# Patient Record
Sex: Male | Born: 1960 | Race: White | Hispanic: No | Marital: Married | Smoking: Light tobacco smoker
Health system: Southern US, Community
[De-identification: ages and names within clinical notes are randomized; demographics above are authoritative.]

## PROBLEM LIST (undated history)

## (undated) DIAGNOSIS — E7800 Pure hypercholesterolemia, unspecified: Secondary | ICD-10-CM

## (undated) DIAGNOSIS — D6851 Activated protein C resistance: Secondary | ICD-10-CM

## (undated) DIAGNOSIS — R51 Headache: Secondary | ICD-10-CM

## (undated) DIAGNOSIS — I639 Cerebral infarction, unspecified: Secondary | ICD-10-CM

## (undated) HISTORY — PX: TONSILLECTOMY: SUR1361

## (undated) HISTORY — PX: COLONOSCOPY: SHX174

---

## 1991-05-09 HISTORY — PX: VASECTOMY: SHX75

## 2013-02-01 ENCOUNTER — Other Ambulatory Visit: Payer: Self-pay | Admitting: Orthopedic Surgery

## 2013-02-04 ENCOUNTER — Encounter (HOSPITAL_COMMUNITY): Payer: Self-pay | Admitting: Pharmacist

## 2013-02-08 ENCOUNTER — Encounter (HOSPITAL_COMMUNITY): Payer: Self-pay

## 2013-02-08 ENCOUNTER — Other Ambulatory Visit (HOSPITAL_COMMUNITY): Payer: Self-pay | Admitting: *Deleted

## 2013-02-08 ENCOUNTER — Ambulatory Visit (HOSPITAL_COMMUNITY)
Admission: RE | Admit: 2013-02-08 | Discharge: 2013-02-08 | Disposition: A | Discharge status: Home / Self Care | Payer: BC Managed Care – PPO | Source: Ambulatory Visit | Attending: Orthopedic Surgery | Admitting: Orthopedic Surgery

## 2013-02-08 ENCOUNTER — Encounter (HOSPITAL_COMMUNITY)
Admission: RE | Admit: 2013-02-08 | Discharge: 2013-02-08 | Disposition: A | Discharge status: Home / Self Care | Payer: BC Managed Care – PPO | Source: Ambulatory Visit | Attending: Orthopedic Surgery | Admitting: Orthopedic Surgery

## 2013-02-08 DIAGNOSIS — Z01810 Encounter for preprocedural cardiovascular examination: Secondary | ICD-10-CM | POA: Insufficient documentation

## 2013-02-08 DIAGNOSIS — Z01812 Encounter for preprocedural laboratory examination: Secondary | ICD-10-CM | POA: Insufficient documentation

## 2013-02-08 DIAGNOSIS — Z01818 Encounter for other preprocedural examination: Secondary | ICD-10-CM | POA: Insufficient documentation

## 2013-02-08 HISTORY — DX: Activated protein C resistance: D68.51

## 2013-02-08 HISTORY — DX: Headache: R51

## 2013-02-08 HISTORY — DX: Pure hypercholesterolemia, unspecified: E78.00

## 2013-02-08 HISTORY — DX: Cerebral infarction, unspecified: I63.9

## 2013-02-08 LAB — URINALYSIS, ROUTINE W REFLEX MICROSCOPIC
Glucose, UA: NEGATIVE mg/dL
Hgb urine dipstick: NEGATIVE
Ketones, ur: NEGATIVE mg/dL
Leukocytes, UA: NEGATIVE
Protein, ur: NEGATIVE mg/dL

## 2013-02-08 LAB — COMPREHENSIVE METABOLIC PANEL
BUN: 16 mg/dL (ref 6–23)
CO2: 26 mEq/L (ref 19–32)
Calcium: 9.3 mg/dL (ref 8.4–10.5)
Creatinine, Ser: 1.15 mg/dL (ref 0.50–1.35)
GFR calc Af Amer: 83 mL/min — ABNORMAL LOW (ref >90)
GFR calc non Af Amer: 72 mL/min — ABNORMAL LOW (ref >90)
Glucose, Bld: 61 mg/dL — ABNORMAL LOW (ref 70–99)

## 2013-02-08 LAB — PROTIME-INR
INR: 1 (ref 0.00–1.49)
Prothrombin Time: 13 seconds (ref 11.6–15.2)

## 2013-02-08 LAB — CBC WITH DIFFERENTIAL/PLATELET
Eosinophils Relative: 2 % (ref 0–5)
HCT: 45 % (ref 39.0–52.0)
Hemoglobin: 16.7 g/dL (ref 13.0–17.0)
Lymphocytes Relative: 24 % (ref 12–46)
Lymphs Abs: 1.4 10*3/uL (ref 0.7–4.0)
MCV: 89.6 fL (ref 78.0–100.0)
Monocytes Absolute: 0.6 10*3/uL (ref 0.1–1.0)
Monocytes Relative: 10 % (ref 3–12)
RBC: 5.02 MIL/uL (ref 4.22–5.81)
WBC: 5.8 10*3/uL (ref 4.0–10.5)

## 2013-02-08 LAB — ABO/RH: ABO/RH(D): O POS

## 2013-02-08 LAB — APTT: aPTT: 31 seconds (ref 24–37)

## 2013-02-08 LAB — TYPE AND SCREEN
ABO/RH(D): O POS
Antibody Screen: NEGATIVE

## 2013-02-08 NOTE — Pre-Procedure Instructions (Signed)
Todd Stafford  02/08/2013   Your procedure is scheduled on:  Thursday, February 10, 2013 at 1:30 PM.   Report to [facility] Entrance "A" at 11:30 AM.   Call this number if you have problems the morning of surgery: [phone]   Remember:   Do not eat food or drink liquids after midnight Wednesday, 02/09/13.   Take these medicines the morning of surgery with A SIP OF WATER: HYDROcodone-acetaminophen (NORCO/VICODIN) - if needed, cyclobenzaprine (FLEXERIL) - if needed, SUMAtriptan (IMITREX) - if needed.  Do not use Excedrin Migraine prior to surgery as of today. Stop Plavix if you have not already done so.    Do not wear jewelry.  Do not wear lotions, powders, or cologne. You may wear deodorant.             Men may shave face and neck.  Do not bring valuables to the hospital.  [facility] is not responsible                  for any belongings or valuables.               Contacts, dentures or bridgework may not be worn into surgery.  Leave suitcase in the car. After surgery it may be brought to your room.  For patients admitted to the hospital, discharge time is determined by your                treatment team.               Patients discharged the day of surgery will not be allowed to drive  home.  Name and phone number of your driver: Family/friend   Special Instructions: Shower using CHG 2 nights before surgery and the night before surgery.  If you shower the day of surgery use CHG.  Use special wash - you have one bottle of CHG for all showers.  You should use approximately 1/3 of the bottle for each shower.   Please read over the following fact sheets that you were given: Pain Booklet, Coughing and Deep Breathing, Blood Transfusion Information, MRSA Information and Surgical Site Infection Prevention

## 2013-02-09 MED ORDER — CEFAZOLIN SODIUM-DEXTROSE 2-3 GM-% IV SOLR
2.0000 g | INTRAVENOUS | Status: AC
  Administered 2013-02-10: 2 g via INTRAVENOUS
  Filled 2013-02-09: qty 50

## 2013-02-10 ENCOUNTER — Ambulatory Visit (HOSPITAL_COMMUNITY): Payer: BC Managed Care – PPO | Admitting: Certified Registered"

## 2013-02-10 ENCOUNTER — Other Ambulatory Visit: Payer: Self-pay

## 2013-02-10 ENCOUNTER — Ambulatory Visit (HOSPITAL_COMMUNITY): Payer: BC Managed Care – PPO

## 2013-02-10 ENCOUNTER — Encounter (HOSPITAL_COMMUNITY)
Admission: RE | Disposition: A | Discharge status: Home / Self Care | Payer: Self-pay | Source: Ambulatory Visit | Attending: Orthopedic Surgery

## 2013-02-10 ENCOUNTER — Encounter (HOSPITAL_COMMUNITY): Payer: Self-pay | Admitting: *Deleted

## 2013-02-10 ENCOUNTER — Ambulatory Visit (HOSPITAL_COMMUNITY)
Admission: RE | Admit: 2013-02-10 | Discharge: 2013-02-10 | Disposition: A | Discharge status: Home / Self Care | Payer: BC Managed Care – PPO | Source: Ambulatory Visit | Attending: Orthopedic Surgery | Admitting: Orthopedic Surgery

## 2013-02-10 ENCOUNTER — Encounter (HOSPITAL_COMMUNITY): Payer: BC Managed Care – PPO | Admitting: Certified Registered"

## 2013-02-10 DIAGNOSIS — D6859 Other primary thrombophilia: Secondary | ICD-10-CM | POA: Insufficient documentation

## 2013-02-10 DIAGNOSIS — E7800 Pure hypercholesterolemia, unspecified: Secondary | ICD-10-CM | POA: Insufficient documentation

## 2013-02-10 DIAGNOSIS — M5126 Other intervertebral disc displacement, lumbar region: Secondary | ICD-10-CM | POA: Insufficient documentation

## 2013-02-10 DIAGNOSIS — Z8673 Personal history of transient ischemic attack (TIA), and cerebral infarction without residual deficits: Secondary | ICD-10-CM | POA: Insufficient documentation

## 2013-02-10 HISTORY — PX: LUMBAR LAMINECTOMY: SHX95

## 2013-02-10 SURGERY — MICRODISCECTOMY LUMBAR LAMINECTOMY
Anesthesia: General | Site: Spine Lumbar | Laterality: Left | Wound class: Clean

## 2013-02-10 MED ORDER — MIDAZOLAM HCL 5 MG/5ML IJ SOLN
INTRAMUSCULAR | Status: DC | PRN
  Administered 2013-02-10: 2 mg via INTRAVENOUS

## 2013-02-10 MED ORDER — METHYLENE BLUE 1 % INJ SOLN
INTRAMUSCULAR | Status: DC | PRN
  Administered 2013-02-10: .5 mL

## 2013-02-10 MED ORDER — THROMBIN 20000 UNITS EX SOLR
CUTANEOUS | Status: AC
  Filled 2013-02-10: qty 20000

## 2013-02-10 MED ORDER — LIDOCAINE HCL (CARDIAC) 20 MG/ML IV SOLN
INTRAVENOUS | Status: DC | PRN
  Administered 2013-02-10: 60 mg via INTRAVENOUS

## 2013-02-10 MED ORDER — BUPIVACAINE-EPINEPHRINE 0.25% -1:200000 IJ SOLN
INTRAMUSCULAR | Status: DC | PRN
  Administered 2013-02-10: 10 mL

## 2013-02-10 MED ORDER — LACTATED RINGERS IV SOLN
INTRAVENOUS | Status: DC
  Administered 2013-02-10 (×2): via INTRAVENOUS

## 2013-02-10 MED ORDER — SUFENTANIL CITRATE 50 MCG/ML IV SOLN
INTRAVENOUS | Status: DC | PRN
  Administered 2013-02-10: 5 ug via INTRAVENOUS
  Administered 2013-02-10: 15 ug via INTRAVENOUS

## 2013-02-10 MED ORDER — PROMETHAZINE HCL 25 MG/ML IJ SOLN: 6.2500 mg | INTRAMUSCULAR | Status: DC | PRN

## 2013-02-10 MED ORDER — POVIDONE-IODINE 7.5 % EX SOLN
Freq: Once | CUTANEOUS | Status: DC
  Filled 2013-02-10: qty 118

## 2013-02-10 MED ORDER — ROCURONIUM BROMIDE 100 MG/10ML IV SOLN
INTRAVENOUS | Status: DC | PRN
  Administered 2013-02-10: 50 mg via INTRAVENOUS

## 2013-02-10 MED ORDER — THROMBIN 20000 UNITS EX SOLR
CUTANEOUS | Status: DC | PRN
  Administered 2013-02-10: 14:00:00

## 2013-02-10 MED ORDER — METHYLPREDNISOLONE ACETATE 40 MG/ML IJ SUSP
INTRAMUSCULAR | Status: DC | PRN
  Administered 2013-02-10: 40 mg

## 2013-02-10 MED ORDER — HEMOSTATIC AGENTS (NO CHARGE) OPTIME
TOPICAL | Status: DC | PRN
  Administered 2013-02-10: 1

## 2013-02-10 MED ORDER — METHYLPREDNISOLONE ACETATE 40 MG/ML IJ SUSP
INTRAMUSCULAR | Status: AC
  Filled 2013-02-10: qty 1

## 2013-02-10 MED ORDER — ONDANSETRON HCL 4 MG/2ML IJ SOLN
INTRAMUSCULAR | Status: DC | PRN
  Administered 2013-02-10: 4 mg via INTRAVENOUS

## 2013-02-10 MED ORDER — PROPOFOL 10 MG/ML IV BOLUS
INTRAVENOUS | Status: DC | PRN
  Administered 2013-02-10: 125 mg via INTRAVENOUS

## 2013-02-10 MED ORDER — HYDROMORPHONE HCL PF 1 MG/ML IJ SOLN
0.2500 mg | INTRAMUSCULAR | Status: DC | PRN
  Administered 2013-02-10: 0.5 mg via INTRAVENOUS

## 2013-02-10 MED ORDER — HYDROMORPHONE HCL PF 1 MG/ML IJ SOLN
INTRAMUSCULAR | Status: AC
  Filled 2013-02-10: qty 1

## 2013-02-10 MED ORDER — 0.9 % SODIUM CHLORIDE (POUR BTL) OPTIME
TOPICAL | Status: DC | PRN
  Administered 2013-02-10: 1000 mL

## 2013-02-10 MED ORDER — BUPIVACAINE-EPINEPHRINE PF 0.25-1:200000 % IJ SOLN
INTRAMUSCULAR | Status: AC
  Filled 2013-02-10: qty 30

## 2013-02-10 SURGICAL SUPPLY — 61 items
BENZOIN TINCTURE PRP APPL 2/3 (GAUZE/BANDAGES/DRESSINGS) ×2 IMPLANT
BUR ROUND PRECISION 4.0 (BURR) ×2 IMPLANT
CANISTER SUCTION 2500CC (MISCELLANEOUS) ×2 IMPLANT
CLOTH BEACON ORANGE TIMEOUT ST (SAFETY) ×2 IMPLANT
CLSR STERI-STRIP ANTIMIC 1/2X4 (GAUZE/BANDAGES/DRESSINGS) ×2 IMPLANT
CONT SPEC STER OR (MISCELLANEOUS) ×2 IMPLANT
CORDS BIPOLAR (ELECTRODE) ×2 IMPLANT
COVER SURGICAL LIGHT HANDLE (MISCELLANEOUS) ×2 IMPLANT
DRAIN CHANNEL 10F 3/8 F FF (DRAIN) IMPLANT
DRAPE POUCH INSTRU U-SHP 10X18 (DRAPES) ×4 IMPLANT
DRAPE SURG 17X23 STRL (DRAPES) ×8 IMPLANT
DURAPREP 26ML APPLICATOR (WOUND CARE) ×2 IMPLANT
ELECT BLADE 4.0 EZ CLEAN MEGAD (MISCELLANEOUS)
ELECT BLADE 6.5 EXT (BLADE) IMPLANT
ELECT CAUTERY BLADE 6.4 (BLADE) ×2 IMPLANT
ELECT REM PT RETURN 9FT ADLT (ELECTROSURGICAL) ×2
ELECTRODE BLDE 4.0 EZ CLN MEGD (MISCELLANEOUS) IMPLANT
ELECTRODE REM PT RTRN 9FT ADLT (ELECTROSURGICAL) ×1 IMPLANT
EVACUATOR SILICONE 100CC (DRAIN) IMPLANT
FILTER STRAW FLUID ASPIR (MISCELLANEOUS) ×2 IMPLANT
GAUZE SPONGE 4X4 16PLY XRAY LF (GAUZE/BANDAGES/DRESSINGS) ×4 IMPLANT
GLOVE BIO SURGEON STRL SZ7 (GLOVE) ×2 IMPLANT
GLOVE BIO SURGEON STRL SZ8 (GLOVE) ×4 IMPLANT
GLOVE BIOGEL PI IND STRL 8 (GLOVE) ×1 IMPLANT
GLOVE BIOGEL PI INDICATOR 8 (GLOVE) ×1
GOWN STRL NON-REIN LRG LVL3 (GOWN DISPOSABLE) ×4 IMPLANT
IV CATH 14GX2 1/4 (CATHETERS) ×2 IMPLANT
KIT BASIN OR (CUSTOM PROCEDURE TRAY) ×2 IMPLANT
KIT ROOM TURNOVER OR (KITS) ×2 IMPLANT
NEEDLE 18GX1X1/2 (RX/OR ONLY) (NEEDLE) ×2 IMPLANT
NEEDLE HYPO 25GX1X1/2 BEV (NEEDLE) ×2 IMPLANT
NEEDLE SPNL 18GX3.5 QUINCKE PK (NEEDLE) ×4 IMPLANT
NEEDLE SPNL 22GX3.5 QUINCKE BK (NEEDLE) IMPLANT
NS IRRIG 1000ML POUR BTL (IV SOLUTION) ×2 IMPLANT
PACK LAMINECTOMY ORTHO (CUSTOM PROCEDURE TRAY) ×2 IMPLANT
PACK UNIVERSAL I (CUSTOM PROCEDURE TRAY) ×2 IMPLANT
PAD ARMBOARD 7.5X6 YLW CONV (MISCELLANEOUS) ×4 IMPLANT
PATTIES SURGICAL .5 X.5 (GAUZE/BANDAGES/DRESSINGS) IMPLANT
PATTIES SURGICAL .5 X1 (DISPOSABLE) ×2 IMPLANT
PATTIES SURGICAL 1X1 (DISPOSABLE) IMPLANT
SPONGE GAUZE 4X4 12PLY (GAUZE/BANDAGES/DRESSINGS) ×2 IMPLANT
STRIP CLOSURE SKIN 1/2X4 (GAUZE/BANDAGES/DRESSINGS) IMPLANT
SURGIFLO TRUKIT (HEMOSTASIS) ×2 IMPLANT
SUT ETHILON 3 0 FSL (SUTURE) IMPLANT
SUT VIC AB 0 CT1 27 (SUTURE) ×1
SUT VIC AB 0 CT1 27XBRD ANBCTR (SUTURE) ×1 IMPLANT
SUT VIC AB 0 CT2 27 (SUTURE) ×4 IMPLANT
SUT VIC AB 1 CT1 18XCR BRD 8 (SUTURE) ×1 IMPLANT
SUT VIC AB 1 CT1 8-18 (SUTURE) ×1
SUT VIC AB 2-0 CT2 18 VCP726D (SUTURE) ×2 IMPLANT
SYR 20CC LL (SYRINGE) IMPLANT
SYR 50ML LL SCALE MARK (SYRINGE) ×2 IMPLANT
SYR BULB IRRIGATION 50ML (SYRINGE) ×2 IMPLANT
SYR CONTROL 10ML LL (SYRINGE) ×2 IMPLANT
SYR TB 1ML 26GX3/8 SAFETY (SYRINGE) ×4 IMPLANT
SYR TB 1ML LUER SLIP (SYRINGE) ×4 IMPLANT
TAPE CLOTH SURG 4X10 WHT LF (GAUZE/BANDAGES/DRESSINGS) ×2 IMPLANT
TOWEL OR 17X24 6PK STRL BLUE (TOWEL DISPOSABLE) ×2 IMPLANT
TOWEL OR 17X26 10 PK STRL BLUE (TOWEL DISPOSABLE) ×2 IMPLANT
WATER STERILE IRR 1000ML POUR (IV SOLUTION) IMPLANT
YANKAUER SUCT BULB TIP NO VENT (SUCTIONS) ×2 IMPLANT

## 2013-02-10 NOTE — Progress Notes (Signed)
Chaplain provided ministry of presence and emotional support. Chaplain provided spiritual support through prayer.   02/10/13 1300  Clinical Encounter Type  Visited With Patient and family together;Health care provider  Visit Type Initial;Spiritual support;Pre-op  Referral From Patient;Family  Spiritual Encounters  Spiritual Needs Prayer

## 2013-02-10 NOTE — H&P (Signed)
PREOPERATIVE H&P  Chief Complaint: left leg pain  HPI: Todd Stafford is a 52 y.o. male who presents with ongoing left leg pain. Pain has been present x 8 months. MRI = left S1 nerve compression. Patient failed conservative care.   Past Medical History  Diagnosis Date  . Headache(784.0)     migraines, treated with Imitrex  . High cholesterol   . Stroke     TIA's x 2 (2010, 2013) on Plavix for this  . Factor 5 Leiden mutation, heterozygous    Past Surgical History  Procedure Laterality Date  . Tonsillectomy    . Vasectomy  05/1991  . Colonoscopy     History   Social History  . Marital Status: Married    Spouse Name: N/A    Number of Children: N/A  . Years of Education: N/A   Social History Main Topics  . Smoking status: Former Smoker    Types: Pipe  . Smokeless tobacco: Never Used  . Alcohol Use: No     Comment: none since 1988  . Drug Use: No  . Sexual Activity: Not on file   Other Topics Concern  . Not on file   Social History Narrative  . No narrative on file   No family history on file. No Known Allergies Prior to Admission medications   Medication Sig Start Date End Date Taking? Authorizing Provider  aspirin-acetaminophen-caffeine (EXCEDRIN MIGRAINE) [phone] MG per tablet Take 2 tablets by mouth every 8 (eight) hours as needed (for headaches).   Yes Historical Provider, MD  clopidogrel (PLAVIX) 75 MG tablet Take 75 mg by mouth daily.   Yes Historical Provider, MD  cyclobenzaprine (FLEXERIL) 10 MG tablet Take 10 mg by mouth 3 (three) times daily as needed for muscle spasms.   Yes Historical Provider, MD  HYDROcodone-acetaminophen (NORCO/VICODIN) 5-325 MG per tablet Take 1 tablet by mouth every 4 (four) hours as needed for pain.   Yes Historical Provider, MD  simvastatin (ZOCOR) 40 MG tablet Take 20 mg by mouth at bedtime.   Yes Historical Provider, MD  SUMAtriptan (IMITREX) 50 MG tablet Take 50 mg by mouth every 2 (two) hours as needed for migraine. May  repeat in 2 hours if headache persists or recurs.   Yes Historical Provider, MD     All other systems have been reviewed and were otherwise negative with the exception of those mentioned in the HPI and as above.  Physical Exam: There were no vitals filed for this visit.  General: Alert, no acute distress Cardiovascular: No pedal edema Respiratory: No cyanosis, no use of accessory musculature Skin: No lesions in the area of chief complaint Neurologic: Sensation intact distally Psychiatric: Patient is competent for consent with normal mood and affect Lymphatic: No axillary or cervical lymphadenopathy  MUSCULOSKELETAL: + SLR on left  Assessment/Plan: Left S1 Radiculopathy Plan for Procedure(s): Left sided lumbar 5-sacrum 1 microdisectomy   Emilee Hero, MD 02/10/2013 7:12 AM

## 2013-02-10 NOTE — Anesthesia Procedure Notes (Signed)
Procedure Name: Intubation Date/Time: 02/10/2013 1:11 PM Performed by: Charm Barges, Deah Ottaway R Pre-anesthesia Checklist: Patient identified, Emergency Drugs available, Suction available, Patient being monitored and Timeout performed Patient Re-evaluated:Patient Re-evaluated prior to inductionOxygen Delivery Method: Circle system utilized Preoxygenation: Pre-oxygenation with 100% oxygen Intubation Type: IV induction Ventilation: Mask ventilation without difficulty Laryngoscope Size: Mac and 4 Grade View: Grade I Tube type: Oral Tube size: 8.0 mm Number of attempts: 1 Airway Equipment and Method: Stylet Placement Confirmation: ETT inserted through vocal cords under direct vision,  positive ETCO2 and breath sounds checked- equal and bilateral Secured at: 23 cm Tube secured with: Tape Dental Injury: Teeth and Oropharynx as per pre-operative assessment

## 2013-02-10 NOTE — Transfer of Care (Signed)
Immediate Anesthesia Transfer of Care Note  Patient: Todd Stafford  Procedure(s) Performed: Procedure(s) with comments: Left sided lumbar 5-sacrum 1 microdisectomy (Left) - Left sided lumbar 5-sacrum 1 microdisectomy  Patient Location: PACU  Anesthesia Type:General  Level of Consciousness: awake, alert  and oriented  Airway & Oxygen Therapy: Patient Spontanous Breathing and Patient connected to nasal cannula oxygen  Post-op Assessment: Report given to PACU RN, Post -op Vital signs reviewed and stable and Patient moving all extremities  Post vital signs: Reviewed and stable  Complications: No apparent anesthesia complications

## 2013-02-10 NOTE — Anesthesia Postprocedure Evaluation (Signed)
  Anesthesia Post-op Note  Patient: Todd Stafford  Procedure(s) Performed: Procedure(s) (LRB): Left sided lumbar 5-sacrum 1 microdisectomy (Left)  Patient Location: PACU  Anesthesia Type: General  Level of Consciousness: awake and alert   Airway and Oxygen Therapy: Patient Spontanous Breathing  Post-op Pain: mild  Post-op Assessment: Post-op Vital signs reviewed, Patient's Cardiovascular Status Stable, Respiratory Function Stable, Patent Airway and No signs of Nausea or vomiting  Last Vitals:  Filed Vitals:   02/10/13 1710  BP: 121/74  Pulse: 79  Temp:   Resp: 15    Post-op Vital Signs: stable   Complications: No apparent anesthesia complications

## 2013-02-10 NOTE — Anesthesia Preprocedure Evaluation (Addendum)
Anesthesia Evaluation  Patient identified by MRN, date of birth, ID band Patient awake    Reviewed: Allergy & Precautions, H&P , NPO status , Patient's Chart, lab work & pertinent test results  Airway Mallampati: II TM Distance: >3 FB Neck ROM: Full    Dental no notable dental hx. (+) Teeth Intact and Dental Advisory Given   Pulmonary former smoker,  breath sounds clear to auscultation  Pulmonary exam normal       Cardiovascular negative cardio ROS  Rhythm:Regular Rate:Normal     Neuro/Psych  Headaches, Plavix. TIACVA negative psych ROS   GI/Hepatic negative GI ROS, Neg liver ROS,   Endo/Other  negative endocrine ROS  Renal/GU negative Renal ROS  negative genitourinary   Musculoskeletal negative musculoskeletal ROS (+)   Abdominal   Peds negative pediatric ROS (+)  Hematology Factor 5 leiden mutation, heterozygous.  Recently on Plavix.   Anesthesia Other Findings   Reproductive/Obstetrics negative OB ROS                          Anesthesia Physical Anesthesia Plan  ASA: II  Anesthesia Plan: General   Post-op Pain Management:    Induction: Intravenous  Airway Management Planned: Oral ETT  Additional Equipment:   Intra-op Plan:   Post-operative Plan: Extubation in OR  Informed Consent: I have reviewed the patients History and Physical, chart, labs and discussed the procedure including the risks, benefits and alternatives for the proposed anesthesia with the patient or authorized representative who has indicated his/her understanding and acceptance.   Dental advisory given  Plan Discussed with: CRNA  Anesthesia Plan Comments:         Anesthesia Quick Evaluation

## 2013-02-11 ENCOUNTER — Encounter (HOSPITAL_COMMUNITY): Payer: Self-pay | Admitting: Orthopedic Surgery

## 2013-02-11 NOTE — Op Note (Signed)
NAME:  Todd Stafford, Todd Stafford NO.:  [id number]  MEDICAL RECORD NO.:  [id number]  LOCATION:  [facility]                         FACILITY:  [facility]  PHYSICIAN:  Estill Bamberg, MD      DATE OF BIRTH:  [birth year]  DATE OF PROCEDURE:  02/10/2013 DATE OF DISCHARGE:  02/10/2013                              OPERATIVE REPORT   PREOPERATIVE DIAGNOSES: 1. Left-sided L5-S1 disk herniation. 2. Left-sided S1 radiculopathy.  POSTOPERATIVE DIAGNOSES: 1. Left-sided L5-S1 disk herniation. 2. Left-sided S1 radiculopathy.  PROCEDURE:  Left-sided L5-S1 laminotomy and partial facetectomy and removal of left-sided L5-S1 intervertebral disk herniation.  SURGEON:  Estill Bamberg, MD  ASSISTANT:  Jason Coop, PA-C  ANESTHESIA:  General endotracheal anesthesia.  COMPLICATIONS:  None.  DISPOSITION:  Stable.  ESTIMATED BLOOD LOSS:  Minimal.  INDICATIONS FOR PROCEDURE:  Briefly, Todd Stafford is a very pleasant 52- year-old male, who did present to me with severe pain in his left leg. MRI did clearly reveal a left-sided L5-S1 disk herniation.  The patient's pain has been ongoing.  It has been ongoing and substantial. He did fail multiple forms of conservative care and did continue to have pain.  We therefore did discuss the pros and cons of proceeding with a left-sided L5-S1 microdiskectomy.  The patient did fully understand the risks and limitations of the procedure and did elect to proceed.  OPERATIVE DETAILS:  On February 10, 2013, the patient was brought to surgery and general endotracheal anesthesia was administered.  The patient was placed prone on a well-padded flat Jackson bed with a spinal frame.  Antibiotics were given and a time-out procedure was performed. The back was prepped and draped in the usual sterile fashion.  I then made an 1 inch midline incision overlying the L5-S1 interspace.  The fascia was incised in a curvilinear manner.  The lamina of L5 and S1 were  subperiosteally exposed.  I then obtained a lateral intraoperative radiograph to confirm the appropriate operative level.  I then used a high-speed bur to remove the medial and superior aspect of the L5 lamina.  The lateral aspect of the ligamentum flavum was identified and taken down.  It was identified and removed.  The traversing S1 nerve root was readily noted, and was readily noted to be under significant tension.  I then continued the partial facetectomy and I was able to medially mobilize the nerve.  I did use a 15-blade knife to perform an annulotomy and I did use a micro pituitary and did remove 1 large disk fragment.  Two additional moderate sized fragments were uneventfully removed through the annulotomy defect.  At the termination of this portion of the procedure, I was easily able to mobilize the nerve, which did confirm appropriate decompression.  I then used Surgiflo to control epidural bleeding.  I then introduced 40 mg of Depo-Medrol about the epidural space.  Prior to infiltrating the Depo-Medrol, I did copiously irrigate the wound.  The fascia was then closed using #1 Vicryl.  The subcutaneous layer was then closed using 2-0 Vicryl and the skin was closed using 3-0 Monocryl.  Benzoin and Steri-Strips were applied followed by a sterile dressing.  All instrument counts were  correct at the termination of the procedure.  Of note, Jason Coop was my assistant throughout the entirety of the procedure and did aide in essential retraction and suctioning needed throughout the surgery.     Estill Bamberg, MD     MD/MEDQ  D:  02/10/2013  T:  02/11/2013  Job:  [id number]

## 2014-11-20 ENCOUNTER — Ambulatory Visit (INDEPENDENT_AMBULATORY_CARE_PROVIDER_SITE_OTHER): Payer: BLUE CROSS/BLUE SHIELD | Admitting: Neurology

## 2014-11-20 ENCOUNTER — Encounter: Payer: Self-pay | Admitting: Neurology

## 2014-11-20 VITALS — BP 129/79 | HR 64 | Ht 72.0 in | Wt 199.0 lb

## 2014-11-20 DIAGNOSIS — G459 Transient cerebral ischemic attack, unspecified: Secondary | ICD-10-CM | POA: Diagnosis not present

## 2014-11-20 DIAGNOSIS — G43709 Chronic migraine without aura, not intractable, without status migrainosus: Secondary | ICD-10-CM | POA: Diagnosis not present

## 2014-11-20 MED ORDER — DICLOFENAC POTASSIUM(MIGRAINE) 50 MG PO PACK: 50.0000 mg | PACK | ORAL | Status: DC | PRN

## 2014-11-20 MED ORDER — NORTRIPTYLINE HCL 10 MG PO CAPS: 10.0000 mg | ORAL_CAPSULE | Freq: Every day | ORAL | Status: DC

## 2014-11-20 NOTE — Patient Instructions (Signed)
Magnesium oxide 400 mg twice a day Riboflavin  100 mg twice a day 

## 2014-11-20 NOTE — Progress Notes (Signed)
PATIENT: Todd Stafford DOB: 1960-08-06  Chief Complaint  Patient presents with  . Migraine    He has been having migraines for 25+ years.  He estimates getting at least 8 migraines monthly.  They are usually assoicated with nausea, dizziness, noise and light sensitivity.  If he is working, he will take Excedrin Migraine and then Imitrex at home.      HISTORICAL  Todd Stafford is a 54 years old right-handed male, seen in refer by  his primary care nurse practitioner Gaye Alken for evaluation of chronic migraines  I reviewed and is summarized most recent office note  He had past medical history of hyperlipidemia, TIA twice, initial one was in 2010, presented with transient confusion, he was taken to local hospital, reported normal MRI of the brain, carotid ultrasound, a second episode was in 2013, he presented with transient left-sided numbness, none of the episode was associated with his headache. Laboratory evaluation demonstrated factor V Leiden mutation  He had history of migraine since 54 years old, his typical migraines are left retro-orbital area severe pounding headache with associated light noise sensitivity, lasting 6-7 hours, to few days, nauseous.  Trigger for her migraines are bright sunlight, exertion, sleep deprivation, too much sleep, processed food, cheese, chocolate, caffeine, He had only has headaches occasionally while younger, but since the [employer] service, and the exposure to [employer] [employer], he noticed increased frequency of headaches, since 1988, he has headaches 2-3 times each week, lasting for a few hours, he usually started with Excedrin Migraine, if it does not help, he take Imitrex 50 mg as needed, which is helpful,  He has tried Inderal as preventive medication in the past, complains of fatigue, difficulty function  I have personally reviewed MRI of brain, and MRA of brain in 03/2012, that was normal.   REVIEW OF SYSTEMS: Full 14 system review of systems  performed and notable only for easy bruising, snoring, headaches, restless legs  ALLERGIES: No Known Allergies  HOME MEDICATIONS: Current Outpatient Prescriptions  Medication Sig Dispense Refill  . aspirin-acetaminophen-caffeine (EXCEDRIN MIGRAINE) 250-250-65 MG per tablet Take 2 tablets by mouth every 8 (eight) hours as needed (for headaches).    . clopidogrel (PLAVIX) 75 MG tablet Take 75 mg by mouth daily.    . naproxen (NAPROSYN) 500 MG tablet Take 500 mg by mouth as needed.    . simvastatin (ZOCOR) 20 MG tablet Take 20 mg by mouth daily.    . SUMAtriptan (IMITREX) 50 MG tablet Take 50 mg by mouth every 2 (two) hours as needed for migraine. May repeat in 2 hours if headache persists or recurs.       PAST MEDICAL HISTORY: Past Medical History  Diagnosis Date  . Headache(784.0)     migraines, treated with Imitrex  . High cholesterol   . Stroke     TIA's x 2 (2010, 2013) on Plavix for this  . Factor 5 Leiden mutation, heterozygous     PAST SURGICAL HISTORY: Past Surgical History  Procedure Laterality Date  . Tonsillectomy    . Vasectomy  05/1991  . Colonoscopy    . Lumbar laminectomy Left 02/10/2013    Procedure: Left sided lumbar 5-sacrum 1 microdisectomy;  Surgeon: Emilee Hero, MD;  Location: [facility] OR;  Service: Orthopedics;  Laterality: Left;  Left sided lumbar 5-sacrum 1 microdisectomy    FAMILY HISTORY: Family History  Problem Relation Age of Onset  . Lung cancer Maternal Grandmother   . Heart attack Paternal Grandmother   .  Kidney failure Paternal Grandmother   . Kidney failure Mother   . Heart attack Mother   . Migraines Mother   . Osteoporosis Mother   . Stroke Father   . Hypertension Father   . Aneurysm Father     Carotid  . COPD Father     SOCIAL HISTORY:  Social History   Social History  . Marital Status: Married    Spouse Name: N/A  . Number of Children: 3  . Years of Education: 16   Occupational History  . Machinist    Social  History Main Topics  . Smoking status: Light Tobacco Smoker    Types: Pipe  . Smokeless tobacco: Never Used     Comment: Smokes a pipe 4 times per year.  . Alcohol Use: No     Comment: none since 1988  . Drug Use: No  . Sexual Activity: Not on file   Other Topics Concern  . Not on file   Social History Narrative   Lives at home with his wife.   Right-handed.   One 32oz tea and one 20oz soda per day.     PHYSICAL EXAM   Filed Vitals:   11/20/14 1207  BP: 129/79  Pulse: 64  Height: 6' (1.829 m)  Weight: 199 lb (90.266 kg)    Not recorded      Body mass index is 26.98 kg/(m^2).  PHYSICAL EXAMNIATION:  Gen: NAD, conversant, well nourised, obese, well groomed                     Cardiovascular: Regular rate rhythm, no peripheral edema, warm, nontender. Eyes: Conjunctivae clear without exudates or hemorrhage Neck: Supple, no carotid bruise. Pulmonary: Clear to auscultation bilaterally   NEUROLOGICAL EXAM:  MENTAL STATUS: Speech:    Speech is normal; fluent and spontaneous with normal comprehension.  Cognition:     Orientation to time, place and person     Normal recent and remote memory     Normal Attention span and concentration     Normal Language, naming, repeating,spontaneous speech     Fund of knowledge   CRANIAL NERVES: CN II: Visual fields are full to confrontation. Fundoscopic exam is normal with sharp discs and no vascular changes. Pupils are round equal and briskly reactive to light. CN III, IV, VI: extraocular movement are normal. No ptosis. CN V: Facial sensation is intact to pinprick in all 3 divisions bilaterally. Corneal responses are intact.  CN VII: Face is symmetric with normal eye closure and smile. CN VIII: Hearing is normal to rubbing fingers CN IX, X: Palate elevates symmetrically. Phonation is normal. CN XI: Head turning and shoulder shrug are intact CN XII: Tongue is midline with normal movements and no atrophy.  MOTOR: There is no  pronator drift of out-stretched arms. Muscle bulk and tone are normal. Muscle strength is normal.  REFLEXES: Reflexes are 2+ and symmetric at the biceps, triceps, knees, and ankles. Plantar responses are flexor.  SENSORY: Intact to light touch, pinprick, position sense, and vibration sense are intact in fingers and toes.  COORDINATION: Rapid alternating movements and fine finger movements are intact. There is no dysmetria on finger-to-nose and heel-knee-shin.    GAIT/STANCE: Posture is normal. Gait is steady with normal steps, base, arm swing, and turning. Heel and toe walking are normal. Tandem gait is normal.  Romberg is absent.   DIAGNOSTIC DATA (LABS, IMAGING, TESTING) - I reviewed patient records, labs, notes, testing and imaging myself where available.  ASSESSMENT AND PLAN  Todd Stafford is a 54 y.o. male   Chronic migraine headaches  Start preventive medications, nortriptyline, 10 mg, titrating to 20 mg every night  He had a history of TIA twice in the past, triptan is no longer good choice,  I have written cambia  as needed TIA  Keep Plavix daily    Levert Feinstein, M.D. Ph.D.  [facility] [address], [address] [place], [place] [address] Ph: [phone] Fax: [phone]  CC: nurse practitioner Amy Waynetta Sandy

## 2014-11-21 ENCOUNTER — Telehealth: Payer: Self-pay | Admitting: *Deleted

## 2014-11-21 ENCOUNTER — Telehealth: Payer: Self-pay | Admitting: Neurology

## 2014-11-21 MED ORDER — DICLOFENAC SODIUM 50 MG PO TBEC: 50.0000 mg | DELAYED_RELEASE_TABLET | Freq: Every day | ORAL | Status: AC

## 2014-11-21 NOTE — Telephone Encounter (Signed)
Raynelle Fanning with [facility] in [place] called statingDiclofenac Potassium 50 MG PACK  Powder packets will cost patient $2000. He prefers the tablets. She can be reached at [phone].

## 2014-11-21 NOTE — Telephone Encounter (Signed)
Patient form on Michelle desk. 

## 2014-11-21 NOTE — Telephone Encounter (Signed)
I called back.  Got no answer on either line.  VM had not been set up.  Unable to leave message.  I called the pharmacy and they will consult patient on drug.

## 2014-11-21 NOTE — Telephone Encounter (Signed)
Jessica: Please let patient know, diclofenac tablet 50 mg as needed will not work as well as diclofenac powder, he may try diclofenac tab prn

## 2014-11-23 ENCOUNTER — Telehealth: Payer: Self-pay | Admitting: Neurology

## 2014-11-23 ENCOUNTER — Telehealth: Payer: Self-pay | Admitting: *Deleted

## 2014-11-23 NOTE — Telephone Encounter (Signed)
Patient form at the front desk for pickup. 

## 2014-11-23 NOTE — Telephone Encounter (Signed)
Todd Stafford with [facility] pharmacy needs clarification for diclofenac (VOLTAREN) 50 MG EC tablet . She said RX came thru as liquid form not tablet. She can be reached at [phone].

## 2014-11-23 NOTE — Telephone Encounter (Signed)
I called back.  Spoke with Misty Stanley.  They were having issues with their system.  Rx has been resolved.  They will proceed with request as ordered.

## 2015-01-15 ENCOUNTER — Ambulatory Visit (INDEPENDENT_AMBULATORY_CARE_PROVIDER_SITE_OTHER): Payer: BLUE CROSS/BLUE SHIELD | Admitting: Neurology

## 2015-01-15 ENCOUNTER — Encounter: Payer: Self-pay | Admitting: Neurology

## 2015-01-15 VITALS — BP 113/76 | HR 83 | Ht 72.0 in | Wt 199.0 lb

## 2015-01-15 DIAGNOSIS — G43709 Chronic migraine without aura, not intractable, without status migrainosus: Secondary | ICD-10-CM

## 2015-01-15 DIAGNOSIS — Z8673 Personal history of transient ischemic attack (TIA), and cerebral infarction without residual deficits: Secondary | ICD-10-CM

## 2015-01-15 DIAGNOSIS — IMO0002 Reserved for concepts with insufficient information to code with codable children: Secondary | ICD-10-CM

## 2015-01-15 NOTE — Progress Notes (Signed)
PATIENT: Todd Stafford DOB: 1960-08-12  Chief Complaint  Patient presents with  . Migraine    The frequency of his migraines has improved with the addition of nortriptyline.  Reports having approximately four migraines in the last two months versus two per week prior to starting a prophylactic medication.  He has only used his diclofenac tablets twice.     HISTORICAL  Todd Stafford is a 54 years old right-handed male, seen in refer by  his primary care nurse practitioner Gaye Alken for evaluation of chronic migraines  I reviewed and is summarized most recent office note  He had past medical history of hyperlipidemia, TIA twice, initial one was in 2010, presented with transient confusion, he was taken to local hospital, reported normal MRI of the brain, carotid ultrasound, a second episode was in 2013, he presented with transient left-sided numbness, none of the episode was associated with his headache. Laboratory evaluation demonstrated factor V Leiden mutation  He had history of migraine since 54 years old, his typical migraines are left retro-orbital area severe pounding headache with associated light noise sensitivity, lasting 6-7 hours, to few days, nauseous.  Trigger for her migraines are bright sunlight, exertion, sleep deprivation, too much sleep, processed food, cheese, chocolate, caffeine, He had only has headaches occasionally while younger, but since the [employer] service, and the exposure to [place] fe[place] ii, he noticed increased frequency of headaches, since 1988, he has headaches 2-3 times each week, lasting for a few hours, he usually started with Excedrin Migraine, if it does not help, he take Imitrex 50 mg as needed, which is helpful,  He has tried Inderal as preventive medication in the past, complains of fatigue, difficulty function  I have personally reviewed MRI of brain, and MRA of brain in 03/2012, that was normal.   Update January 15 2015: His headache has much  improved with nortriptyline 10 mg every night, he is also taking magnesium oxide, riboflavin twice a day as preventive medications, both was helpful, he denies significant side effect, he only had 2 migraines over the past 2 months, diclofenac as needed was helpful to. His insurance will not cover cambia   REVIEW OF SYSTEMS: Full 14 system review of systems performed and notable only for easy bruising, snoring, headaches, restless legs  ALLERGIES: No Known Allergies  HOME MEDICATIONS: Current Outpatient Prescriptions  Medication Sig Dispense Refill  . aspirin-acetaminophen-caffeine (EXCEDRIN MIGRAINE) 250-250-65 MG per tablet Take 2 tablets by mouth every 8 (eight) hours as needed (for headaches).    . clopidogrel (PLAVIX) 75 MG tablet Take 75 mg by mouth daily.    . naproxen (NAPROSYN) 500 MG tablet Take 500 mg by mouth as needed.    . simvastatin (ZOCOR) 20 MG tablet Take 20 mg by mouth daily.    . SUMAtriptan (IMITREX) 50 MG tablet Take 50 mg by mouth every 2 (two) hours as needed for migraine. May repeat in 2 hours if headache persists or recurs.       PAST MEDICAL HISTORY: Past Medical History  Diagnosis Date  . Headache(784.0)     migraines, treated with Imitrex  . High cholesterol   . Stroke [facility])     TIA's x 2 (2010, 2013) on Plavix for this  . Factor 5 Leiden mutation, heterozygous (HCC)     PAST SURGICAL HISTORY: Past Surgical History  Procedure Laterality Date  . Tonsillectomy    . Vasectomy  05/1991  . Colonoscopy    . Lumbar laminectomy Left 02/10/2013  Procedure: Left sided lumbar 5-sacrum 1 microdisectomy;  Surgeon: Emilee Hero, MD;  Location: [facility] OR;  Service: Orthopedics;  Laterality: Left;  Left sided lumbar 5-sacrum 1 microdisectomy    FAMILY HISTORY: Family History  Problem Relation Age of Onset  . Lung cancer Maternal Grandmother   . Heart attack Paternal Grandmother   . Kidney failure Paternal Grandmother   . Kidney failure Mother   .  Heart attack Mother   . Migraines Mother   . Osteoporosis Mother   . Stroke Father   . Hypertension Father   . Aneurysm Father     Carotid  . COPD Father     SOCIAL HISTORY:  Social History   Social History  . Marital Status: Married    Spouse Name: N/A  . Number of Children: 3  . Years of Education: 16   Occupational History  . Machinist    Social History Main Topics  . Smoking status: Light Tobacco Smoker    Types: Pipe  . Smokeless tobacco: Never Used     Comment: Smokes a pipe 4 times per year.  . Alcohol Use: No     Comment: none since 1988  . Drug Use: No  . Sexual Activity: Not on file   Other Topics Concern  . Not on file   Social History Narrative   Lives at home with his wife.   Right-handed.   One 32oz tea and one 20oz soda per day.     PHYSICAL EXAM   Filed Vitals:   01/15/15 0726  BP: 113/76  Pulse: 83  Height: 6' (1.829 m)  Weight: 199 lb (90.266 kg)    Not recorded      Body mass index is 26.98 kg/(m^2).  PHYSICAL EXAMNIATION:  Gen: NAD, conversant, well nourised, obese, well groomed                     Cardiovascular: Regular rate rhythm, no peripheral edema, warm, nontender. Eyes: Conjunctivae clear without exudates or hemorrhage Neck: Supple, no carotid bruise. Pulmonary: Clear to auscultation bilaterally   NEUROLOGICAL EXAM:  MENTAL STATUS: Speech:    Speech is normal; fluent and spontaneous with normal comprehension.  Cognition:     Orientation to time, place and person     Normal recent and remote memory     Normal Attention span and concentration     Normal Language, naming, repeating,spontaneous speech     Fund of knowledge   CRANIAL NERVES: CN II: Visual fields are full to confrontation. Fundoscopic exam is normal with sharp discs and no vascular changes. Pupils are round equal and briskly reactive to light. CN III, IV, VI: extraocular movement are normal. No ptosis. CN V: Facial sensation is intact to pinprick  in all 3 divisions bilaterally. Corneal responses are intact.  CN VII: Face is symmetric with normal eye closure and smile. CN VIII: Hearing is normal to rubbing fingers CN IX, X: Palate elevates symmetrically. Phonation is normal. CN XI: Head turning and shoulder shrug are intact CN XII: Tongue is midline with normal movements and no atrophy.  MOTOR: There is no pronator drift of out-stretched arms. Muscle bulk and tone are normal. Muscle strength is normal.  REFLEXES: Reflexes are 2+ and symmetric at the biceps, triceps, knees, and ankles. Plantar responses are flexor.  SENSORY: Intact to light touch, pinprick, position sense, and vibration sense are intact in fingers and toes.  COORDINATION: Rapid alternating movements and fine finger movements are intact. There  is no dysmetria on finger-to-nose and heel-knee-shin.    GAIT/STANCE: Posture is normal. Gait is steady with normal steps, base, arm swing, and turning. Heel and toe walking are normal. Tandem gait is normal.  Romberg is absent.   DIAGNOSTIC DATA (LABS, IMAGING, TESTING) - I reviewed patient records, labs, notes, testing and imaging myself where available.  ASSESSMENT AND PLAN  Todd Stafford is a 54 y.o. male   Chronic migraine headaches  Continue nortriptyline, 10 mg every night  He had a history of TIA twice in the past, triptan is no longer good choice,  NSAIDs as needed TIA  Keep Plavix daily    Levert Feinstein, M.D. Ph.D.  [facility] [address], [address] [place], [place] [address] Ph: [phone] Fax: [phone]  CC: nurse practitioner Amy Waynetta Sandy

## 2015-04-22 IMAGING — CR DG CHEST 2V
2 series · 2 of 2 positions shown · non-contrast
Comparison: None.

CLINICAL DATA: Pre-admission for lumbar fusion.  History of stroke.

EXAM:
CHEST  2 VIEW

[w chest pa]
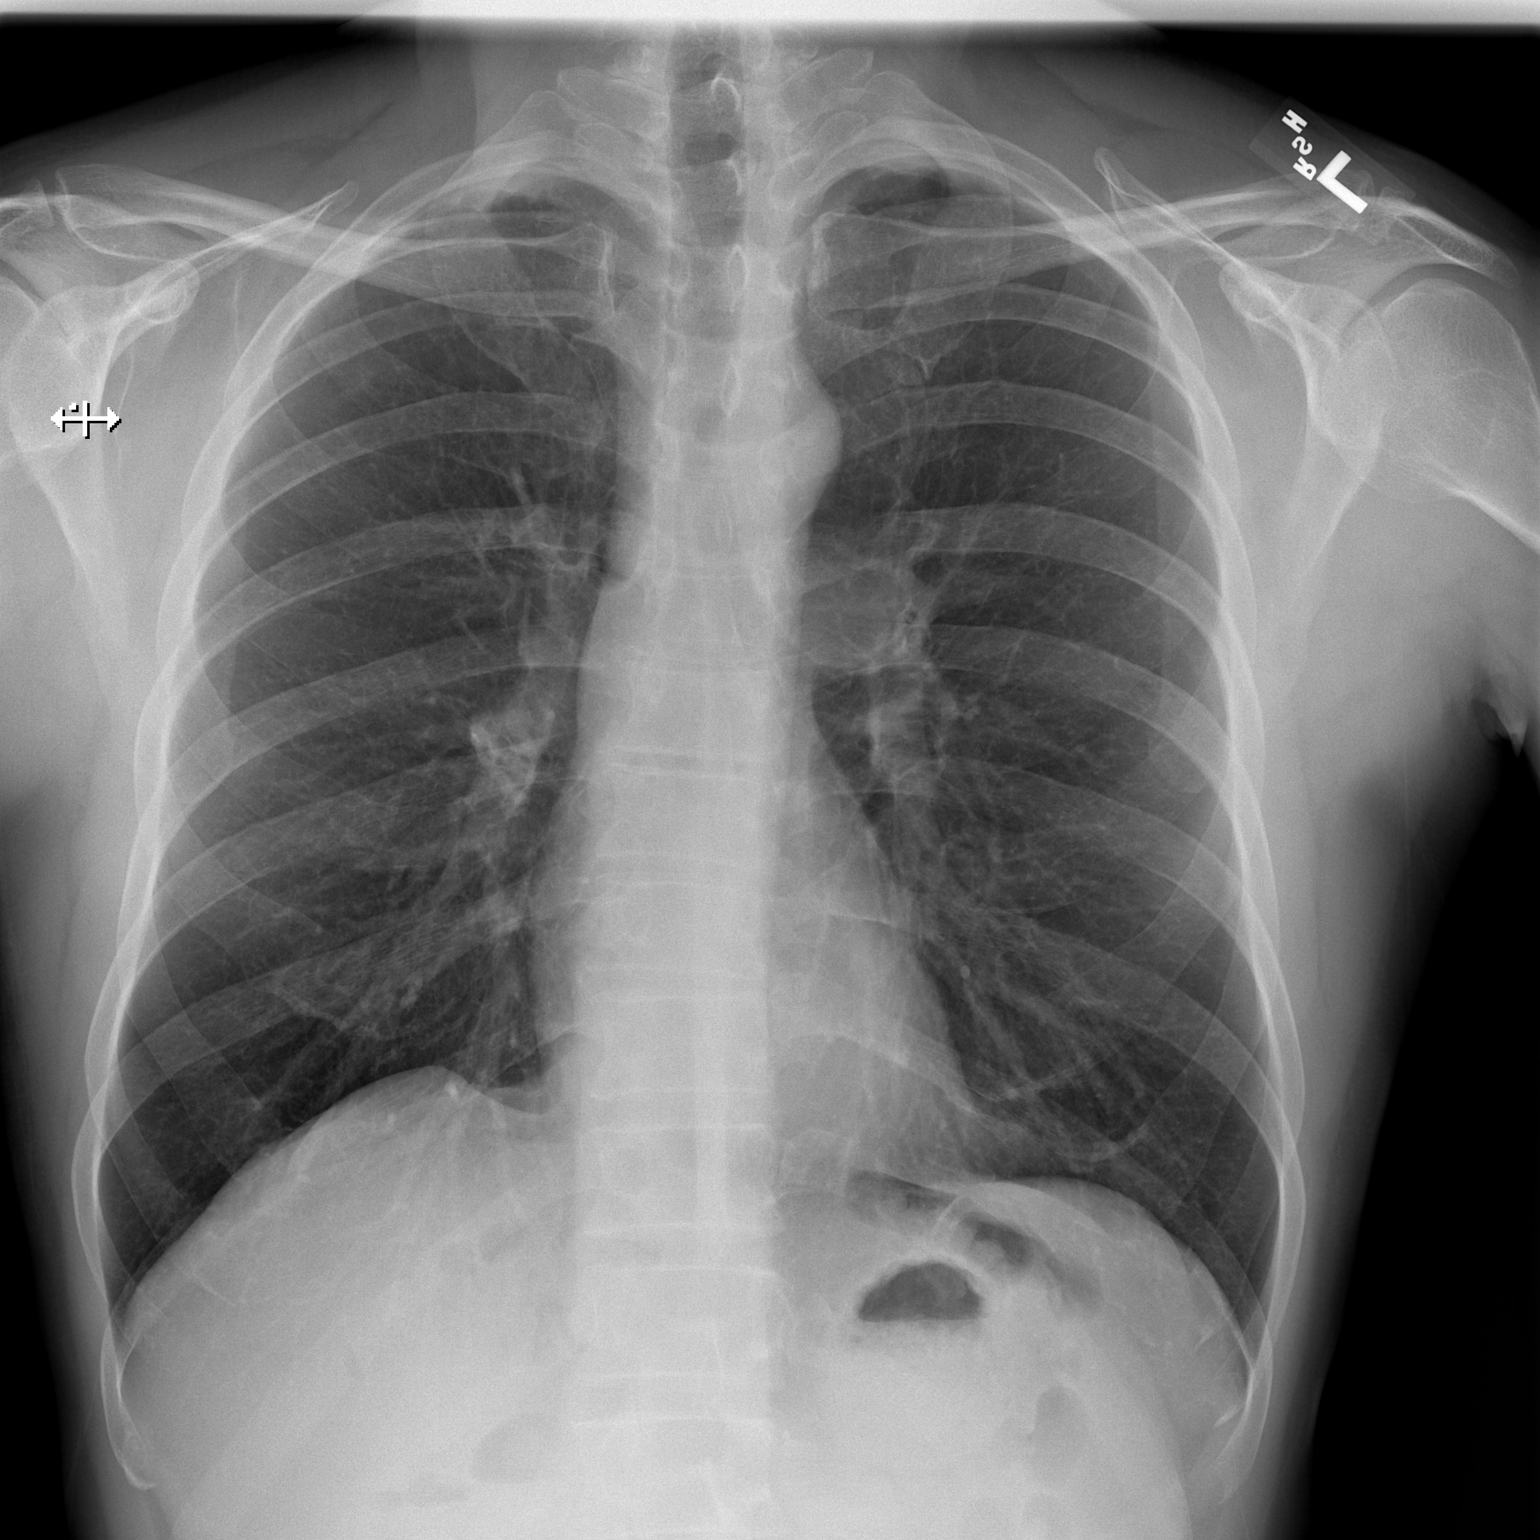

[w chest lat]
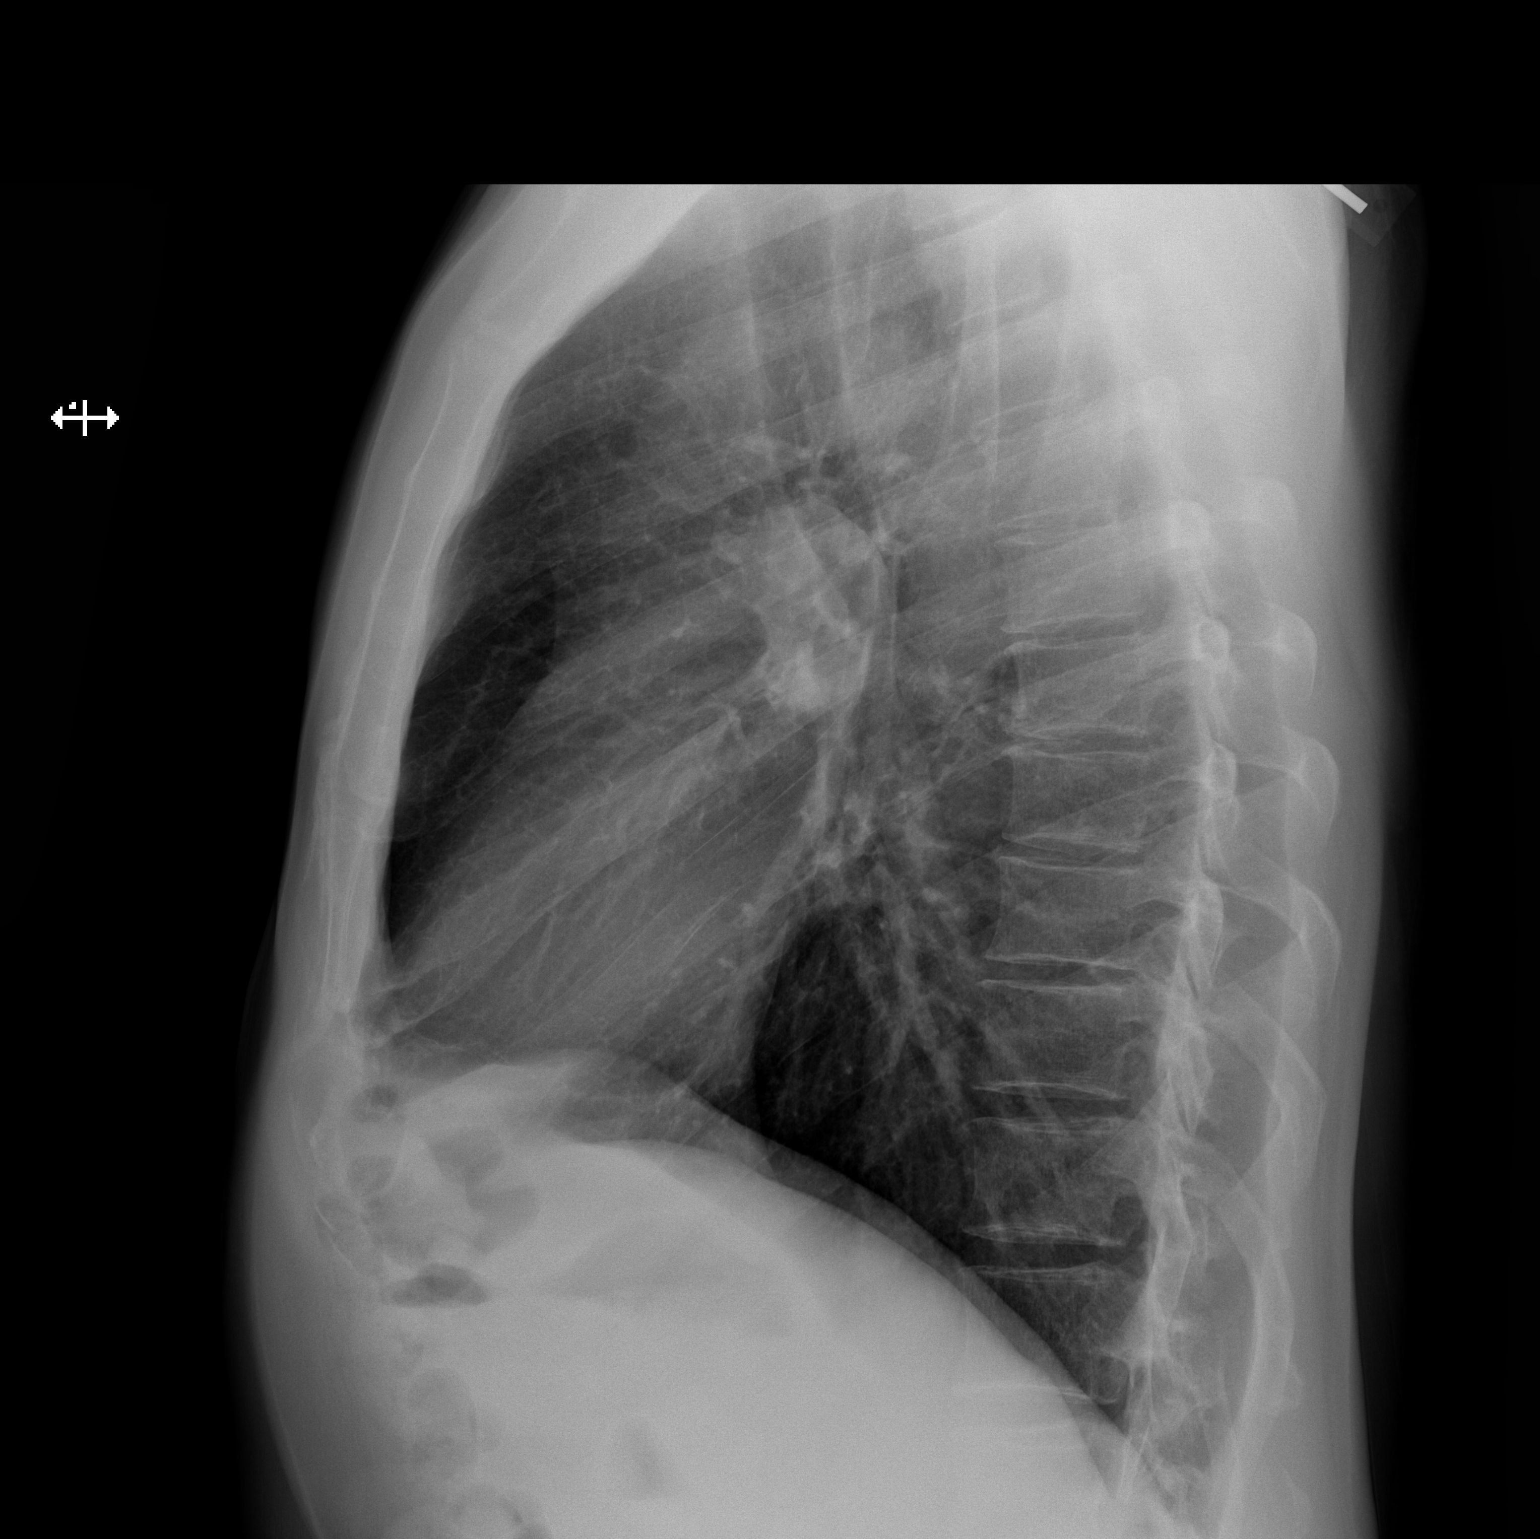

[2 of 2 positions shown; findings below may reference images not displayed]

FINDINGS: The heart size and mediastinal contours are within normal limits.

The lungs are clear other than minor apical pleural parenchymal
scarring. No pleural effusion or pneumothorax.

The bony thorax is intact. There are minor mid thoracic spine
degenerative changes.
IMPRESSION: No active cardiopulmonary disease.

## 2015-07-18 ENCOUNTER — Ambulatory Visit: Payer: Non-veteran care | Admitting: Nurse Practitioner

## 2015-08-02 ENCOUNTER — Other Ambulatory Visit: Payer: Self-pay | Admitting: *Deleted

## 2015-08-02 MED ORDER — NORTRIPTYLINE HCL 10 MG PO CAPS
10.0000 mg | ORAL_CAPSULE | Freq: Every day | ORAL | Status: AC
Start: 2015-08-02 — End: ?

## 2015-12-14 DIAGNOSIS — Z1389 Encounter for screening for other disorder: Secondary | ICD-10-CM | POA: Diagnosis not present

## 2015-12-14 DIAGNOSIS — G43909 Migraine, unspecified, not intractable, without status migrainosus: Secondary | ICD-10-CM | POA: Diagnosis not present

## 2015-12-14 DIAGNOSIS — F329 Major depressive disorder, single episode, unspecified: Secondary | ICD-10-CM | POA: Diagnosis not present

## 2016-01-11 DIAGNOSIS — G43909 Migraine, unspecified, not intractable, without status migrainosus: Secondary | ICD-10-CM | POA: Diagnosis not present

## 2016-01-11 DIAGNOSIS — F329 Major depressive disorder, single episode, unspecified: Secondary | ICD-10-CM | POA: Diagnosis not present

## 2016-01-15 DIAGNOSIS — G509 Disorder of trigeminal nerve, unspecified: Secondary | ICD-10-CM | POA: Diagnosis not present

## 2016-01-15 DIAGNOSIS — G508 Other disorders of trigeminal nerve: Secondary | ICD-10-CM | POA: Diagnosis not present

## 2016-01-15 DIAGNOSIS — R55 Syncope and collapse: Secondary | ICD-10-CM | POA: Diagnosis not present

## 2016-01-15 DIAGNOSIS — R51 Headache: Secondary | ICD-10-CM | POA: Diagnosis not present

## 2016-01-15 DIAGNOSIS — E860 Dehydration: Secondary | ICD-10-CM | POA: Diagnosis not present

## 2016-01-24 DIAGNOSIS — Z6828 Body mass index (BMI) 28.0-28.9, adult: Secondary | ICD-10-CM | POA: Diagnosis not present

## 2016-05-30 DIAGNOSIS — H2631 Drug-induced cataract, right eye: Secondary | ICD-10-CM | POA: Diagnosis not present

## 2016-06-10 DIAGNOSIS — H2513 Age-related nuclear cataract, bilateral: Secondary | ICD-10-CM | POA: Diagnosis not present

## 2016-06-10 DIAGNOSIS — H5231 Anisometropia: Secondary | ICD-10-CM | POA: Diagnosis not present

## 2016-06-10 DIAGNOSIS — H524 Presbyopia: Secondary | ICD-10-CM | POA: Diagnosis not present

## 2016-06-10 DIAGNOSIS — H43819 Vitreous degeneration, unspecified eye: Secondary | ICD-10-CM | POA: Diagnosis not present

## 2016-06-12 DIAGNOSIS — H2513 Age-related nuclear cataract, bilateral: Secondary | ICD-10-CM | POA: Diagnosis not present

## 2016-06-13 DIAGNOSIS — H269 Unspecified cataract: Secondary | ICD-10-CM | POA: Diagnosis not present

## 2016-06-16 DIAGNOSIS — H2513 Age-related nuclear cataract, bilateral: Secondary | ICD-10-CM | POA: Diagnosis not present

## 2016-06-16 DIAGNOSIS — H25043 Posterior subcapsular polar age-related cataract, bilateral: Secondary | ICD-10-CM | POA: Diagnosis not present

## 2016-06-18 DIAGNOSIS — H5231 Anisometropia: Secondary | ICD-10-CM | POA: Diagnosis not present

## 2016-06-20 DIAGNOSIS — Z0189 Encounter for other specified special examinations: Secondary | ICD-10-CM | POA: Diagnosis not present

## 2016-06-20 DIAGNOSIS — G44009 Cluster headache syndrome, unspecified, not intractable: Secondary | ICD-10-CM | POA: Diagnosis not present

## 2016-06-20 DIAGNOSIS — I10 Essential (primary) hypertension: Secondary | ICD-10-CM | POA: Diagnosis not present

## 2016-06-20 DIAGNOSIS — D6851 Activated protein C resistance: Secondary | ICD-10-CM | POA: Diagnosis not present

## 2016-06-20 DIAGNOSIS — E785 Hyperlipidemia, unspecified: Secondary | ICD-10-CM | POA: Diagnosis not present

## 2016-06-20 DIAGNOSIS — Z8673 Personal history of transient ischemic attack (TIA), and cerebral infarction without residual deficits: Secondary | ICD-10-CM | POA: Diagnosis not present

## 2016-07-17 DIAGNOSIS — Z9841 Cataract extraction status, right eye: Secondary | ICD-10-CM | POA: Diagnosis not present

## 2016-07-17 DIAGNOSIS — H2512 Age-related nuclear cataract, left eye: Secondary | ICD-10-CM | POA: Diagnosis not present

## 2016-07-17 DIAGNOSIS — H5231 Anisometropia: Secondary | ICD-10-CM | POA: Diagnosis not present

## 2016-07-17 DIAGNOSIS — Z48810 Encounter for surgical aftercare following surgery on the sense organs: Secondary | ICD-10-CM | POA: Diagnosis not present

## 2016-08-27 DIAGNOSIS — H5231 Anisometropia: Secondary | ICD-10-CM | POA: Diagnosis not present

## 2016-09-09 DIAGNOSIS — H2512 Age-related nuclear cataract, left eye: Secondary | ICD-10-CM | POA: Diagnosis not present

## 2016-09-18 DIAGNOSIS — H52202 Unspecified astigmatism, left eye: Secondary | ICD-10-CM | POA: Diagnosis not present

## 2016-09-18 DIAGNOSIS — H25042 Posterior subcapsular polar age-related cataract, left eye: Secondary | ICD-10-CM | POA: Diagnosis not present

## 2016-09-18 DIAGNOSIS — H2512 Age-related nuclear cataract, left eye: Secondary | ICD-10-CM | POA: Diagnosis not present

## 2016-09-18 DIAGNOSIS — H25012 Cortical age-related cataract, left eye: Secondary | ICD-10-CM | POA: Diagnosis not present

## 2016-11-05 DIAGNOSIS — Z9842 Cataract extraction status, left eye: Secondary | ICD-10-CM | POA: Diagnosis not present

## 2016-11-05 DIAGNOSIS — Z48810 Encounter for surgical aftercare following surgery on the sense organs: Secondary | ICD-10-CM | POA: Diagnosis not present

## 2016-11-05 DIAGNOSIS — Z961 Presence of intraocular lens: Secondary | ICD-10-CM | POA: Diagnosis not present

## 2016-11-05 DIAGNOSIS — G43909 Migraine, unspecified, not intractable, without status migrainosus: Secondary | ICD-10-CM | POA: Diagnosis not present

## 2017-01-26 DIAGNOSIS — M9951 Intervertebral disc stenosis of neural canal of cervical region: Secondary | ICD-10-CM | POA: Diagnosis not present

## 2017-01-26 DIAGNOSIS — M4802 Spinal stenosis, cervical region: Secondary | ICD-10-CM | POA: Diagnosis not present

## 2017-02-03 DIAGNOSIS — M4802 Spinal stenosis, cervical region: Secondary | ICD-10-CM | POA: Diagnosis not present

## 2017-02-19 DIAGNOSIS — M21539 Acquired clawfoot, unspecified foot: Secondary | ICD-10-CM | POA: Diagnosis not present

## 2017-02-19 DIAGNOSIS — L603 Nail dystrophy: Secondary | ICD-10-CM | POA: Diagnosis not present

## 2017-02-19 DIAGNOSIS — M2042 Other hammer toe(s) (acquired), left foot: Secondary | ICD-10-CM | POA: Diagnosis not present

## 2017-02-24 DIAGNOSIS — M4802 Spinal stenosis, cervical region: Secondary | ICD-10-CM | POA: Diagnosis not present

## 2017-03-02 DIAGNOSIS — M4802 Spinal stenosis, cervical region: Secondary | ICD-10-CM | POA: Diagnosis not present

## 2017-03-03 DIAGNOSIS — H4420 Degenerative myopia, unspecified eye: Secondary | ICD-10-CM | POA: Diagnosis not present

## 2017-03-03 DIAGNOSIS — H35413 Lattice degeneration of retina, bilateral: Secondary | ICD-10-CM | POA: Diagnosis not present

## 2017-03-03 DIAGNOSIS — Z961 Presence of intraocular lens: Secondary | ICD-10-CM | POA: Diagnosis not present

## 2017-03-10 DIAGNOSIS — M4802 Spinal stenosis, cervical region: Secondary | ICD-10-CM | POA: Diagnosis not present

## 2017-03-24 DIAGNOSIS — M4802 Spinal stenosis, cervical region: Secondary | ICD-10-CM | POA: Diagnosis not present

## 2017-04-03 DIAGNOSIS — M542 Cervicalgia: Secondary | ICD-10-CM | POA: Diagnosis not present

## 2017-04-08 DIAGNOSIS — M542 Cervicalgia: Secondary | ICD-10-CM | POA: Diagnosis not present

## 2017-04-21 DIAGNOSIS — M542 Cervicalgia: Secondary | ICD-10-CM | POA: Diagnosis not present

## 2017-04-28 DIAGNOSIS — R202 Paresthesia of skin: Secondary | ICD-10-CM | POA: Diagnosis not present

## 2017-04-28 DIAGNOSIS — M79602 Pain in left arm: Secondary | ICD-10-CM | POA: Diagnosis not present

## 2017-06-04 DIAGNOSIS — M542 Cervicalgia: Secondary | ICD-10-CM | POA: Diagnosis not present

## 2017-07-08 DIAGNOSIS — M4802 Spinal stenosis, cervical region: Secondary | ICD-10-CM | POA: Diagnosis not present

## 2017-07-08 DIAGNOSIS — R5381 Other malaise: Secondary | ICD-10-CM | POA: Diagnosis not present

## 2017-07-08 DIAGNOSIS — F329 Major depressive disorder, single episode, unspecified: Secondary | ICD-10-CM | POA: Diagnosis not present

## 2017-07-08 DIAGNOSIS — R42 Dizziness and giddiness: Secondary | ICD-10-CM | POA: Diagnosis not present

## 2017-08-10 DIAGNOSIS — R0789 Other chest pain: Secondary | ICD-10-CM | POA: Diagnosis not present

## 2017-08-20 DIAGNOSIS — E039 Hypothyroidism, unspecified: Secondary | ICD-10-CM | POA: Diagnosis not present

## 2017-08-20 DIAGNOSIS — R5381 Other malaise: Secondary | ICD-10-CM | POA: Diagnosis not present

## 2017-08-20 DIAGNOSIS — Z1331 Encounter for screening for depression: Secondary | ICD-10-CM | POA: Diagnosis not present

## 2017-08-20 DIAGNOSIS — F329 Major depressive disorder, single episode, unspecified: Secondary | ICD-10-CM | POA: Diagnosis not present

## 2017-08-24 DIAGNOSIS — G43909 Migraine, unspecified, not intractable, without status migrainosus: Secondary | ICD-10-CM | POA: Diagnosis not present

## 2017-08-24 DIAGNOSIS — E039 Hypothyroidism, unspecified: Secondary | ICD-10-CM | POA: Diagnosis not present

## 2017-08-24 DIAGNOSIS — Z7902 Long term (current) use of antithrombotics/antiplatelets: Secondary | ICD-10-CM | POA: Diagnosis not present

## 2017-08-24 DIAGNOSIS — E785 Hyperlipidemia, unspecified: Secondary | ICD-10-CM | POA: Diagnosis not present

## 2017-08-24 DIAGNOSIS — Z8673 Personal history of transient ischemic attack (TIA), and cerebral infarction without residual deficits: Secondary | ICD-10-CM | POA: Diagnosis not present

## 2017-08-24 DIAGNOSIS — Z125 Encounter for screening for malignant neoplasm of prostate: Secondary | ICD-10-CM | POA: Diagnosis not present

## 2017-09-01 DIAGNOSIS — H04123 Dry eye syndrome of bilateral lacrimal glands: Secondary | ICD-10-CM | POA: Diagnosis not present

## 2017-09-01 DIAGNOSIS — H43811 Vitreous degeneration, right eye: Secondary | ICD-10-CM | POA: Diagnosis not present

## 2017-09-01 DIAGNOSIS — H16143 Punctate keratitis, bilateral: Secondary | ICD-10-CM | POA: Diagnosis not present

## 2017-09-01 DIAGNOSIS — Z961 Presence of intraocular lens: Secondary | ICD-10-CM | POA: Diagnosis not present

## 2017-12-17 DIAGNOSIS — Z7901 Long term (current) use of anticoagulants: Secondary | ICD-10-CM | POA: Diagnosis not present

## 2017-12-17 DIAGNOSIS — Z8673 Personal history of transient ischemic attack (TIA), and cerebral infarction without residual deficits: Secondary | ICD-10-CM | POA: Diagnosis not present

## 2017-12-17 DIAGNOSIS — M5412 Radiculopathy, cervical region: Secondary | ICD-10-CM | POA: Diagnosis not present

## 2017-12-17 DIAGNOSIS — R94131 Abnormal electromyogram [EMG]: Secondary | ICD-10-CM | POA: Diagnosis not present

## 2017-12-24 DIAGNOSIS — Z6827 Body mass index (BMI) 27.0-27.9, adult: Secondary | ICD-10-CM | POA: Diagnosis not present

## 2017-12-24 DIAGNOSIS — J069 Acute upper respiratory infection, unspecified: Secondary | ICD-10-CM | POA: Diagnosis not present

## 2018-01-07 DIAGNOSIS — M4802 Spinal stenosis, cervical region: Secondary | ICD-10-CM | POA: Diagnosis not present

## 2018-01-07 DIAGNOSIS — M79641 Pain in right hand: Secondary | ICD-10-CM | POA: Diagnosis not present

## 2018-01-15 DIAGNOSIS — Z8673 Personal history of transient ischemic attack (TIA), and cerebral infarction without residual deficits: Secondary | ICD-10-CM | POA: Diagnosis not present

## 2018-01-15 DIAGNOSIS — M792 Neuralgia and neuritis, unspecified: Secondary | ICD-10-CM | POA: Diagnosis not present

## 2018-01-15 DIAGNOSIS — G43909 Migraine, unspecified, not intractable, without status migrainosus: Secondary | ICD-10-CM | POA: Diagnosis not present

## 2018-01-15 DIAGNOSIS — M4802 Spinal stenosis, cervical region: Secondary | ICD-10-CM | POA: Diagnosis not present

## 2018-04-27 DIAGNOSIS — Z6827 Body mass index (BMI) 27.0-27.9, adult: Secondary | ICD-10-CM | POA: Diagnosis not present

## 2018-04-27 DIAGNOSIS — F329 Major depressive disorder, single episode, unspecified: Secondary | ICD-10-CM | POA: Diagnosis not present

## 2018-04-27 DIAGNOSIS — R5381 Other malaise: Secondary | ICD-10-CM | POA: Diagnosis not present

## 2018-05-14 DIAGNOSIS — F339 Major depressive disorder, recurrent, unspecified: Secondary | ICD-10-CM | POA: Diagnosis not present

## 2018-05-18 DIAGNOSIS — F321 Major depressive disorder, single episode, moderate: Secondary | ICD-10-CM | POA: Diagnosis not present

## 2018-05-18 DIAGNOSIS — R5381 Other malaise: Secondary | ICD-10-CM | POA: Diagnosis not present

## 2018-05-18 DIAGNOSIS — Z6827 Body mass index (BMI) 27.0-27.9, adult: Secondary | ICD-10-CM | POA: Diagnosis not present

## 2018-05-25 DIAGNOSIS — F339 Major depressive disorder, recurrent, unspecified: Secondary | ICD-10-CM | POA: Diagnosis not present

## 2018-06-14 DIAGNOSIS — Z630 Problems in relationship with spouse or partner: Secondary | ICD-10-CM | POA: Diagnosis not present

## 2018-06-14 DIAGNOSIS — F339 Major depressive disorder, recurrent, unspecified: Secondary | ICD-10-CM | POA: Diagnosis not present

## 2018-06-25 DIAGNOSIS — Z630 Problems in relationship with spouse or partner: Secondary | ICD-10-CM | POA: Diagnosis not present

## 2018-06-25 DIAGNOSIS — F339 Major depressive disorder, recurrent, unspecified: Secondary | ICD-10-CM | POA: Diagnosis not present

## 2018-06-28 DIAGNOSIS — Z630 Problems in relationship with spouse or partner: Secondary | ICD-10-CM | POA: Diagnosis not present

## 2018-06-28 DIAGNOSIS — F339 Major depressive disorder, recurrent, unspecified: Secondary | ICD-10-CM | POA: Diagnosis not present

## 2018-09-13 DIAGNOSIS — G8929 Other chronic pain: Secondary | ICD-10-CM | POA: Diagnosis not present

## 2018-09-13 DIAGNOSIS — Z6826 Body mass index (BMI) 26.0-26.9, adult: Secondary | ICD-10-CM | POA: Diagnosis not present

## 2018-10-12 DIAGNOSIS — M47816 Spondylosis without myelopathy or radiculopathy, lumbar region: Secondary | ICD-10-CM | POA: Diagnosis not present

## 2018-10-12 DIAGNOSIS — M47812 Spondylosis without myelopathy or radiculopathy, cervical region: Secondary | ICD-10-CM | POA: Diagnosis not present

## 2018-10-12 DIAGNOSIS — E785 Hyperlipidemia, unspecified: Secondary | ICD-10-CM | POA: Diagnosis not present

## 2018-10-12 DIAGNOSIS — G43909 Migraine, unspecified, not intractable, without status migrainosus: Secondary | ICD-10-CM | POA: Diagnosis not present

## 2018-11-09 DIAGNOSIS — Z961 Presence of intraocular lens: Secondary | ICD-10-CM | POA: Diagnosis not present

## 2018-12-28 DIAGNOSIS — M79644 Pain in right finger(s): Secondary | ICD-10-CM | POA: Diagnosis not present

## 2018-12-28 DIAGNOSIS — G43909 Migraine, unspecified, not intractable, without status migrainosus: Secondary | ICD-10-CM | POA: Diagnosis not present

## 2018-12-28 DIAGNOSIS — M79641 Pain in right hand: Secondary | ICD-10-CM | POA: Diagnosis not present

## 2018-12-28 DIAGNOSIS — Z8673 Personal history of transient ischemic attack (TIA), and cerebral infarction without residual deficits: Secondary | ICD-10-CM | POA: Diagnosis not present

## 2019-01-24 DIAGNOSIS — R251 Tremor, unspecified: Secondary | ICD-10-CM | POA: Diagnosis not present

## 2019-02-05 DIAGNOSIS — S40812A Abrasion of left upper arm, initial encounter: Secondary | ICD-10-CM | POA: Diagnosis not present

## 2019-05-07 DIAGNOSIS — S61011A Laceration without foreign body of right thumb without damage to nail, initial encounter: Secondary | ICD-10-CM | POA: Diagnosis not present

## 2019-05-16 DIAGNOSIS — E039 Hypothyroidism, unspecified: Secondary | ICD-10-CM | POA: Diagnosis not present

## 2019-05-16 DIAGNOSIS — G44309 Post-traumatic headache, unspecified, not intractable: Secondary | ICD-10-CM | POA: Diagnosis not present

## 2019-09-15 DIAGNOSIS — R6889 Other general symptoms and signs: Secondary | ICD-10-CM | POA: Diagnosis not present

## 2019-09-15 DIAGNOSIS — R05 Cough: Secondary | ICD-10-CM | POA: Diagnosis not present

## 2019-09-15 DIAGNOSIS — Z20822 Contact with and (suspected) exposure to covid-19: Secondary | ICD-10-CM | POA: Diagnosis not present

## 2019-09-23 ENCOUNTER — Encounter (HOSPITAL_COMMUNITY): Payer: Self-pay

## 2019-09-23 ENCOUNTER — Emergency Department (HOSPITAL_COMMUNITY): Payer: No Typology Code available for payment source

## 2019-09-23 ENCOUNTER — Emergency Department (HOSPITAL_COMMUNITY)
Admission: EM | Admit: 2019-09-23 | Discharge: 2019-09-23 | Disposition: A | Discharge status: Home / Self Care | Payer: No Typology Code available for payment source | Attending: Emergency Medicine | Admitting: Emergency Medicine

## 2019-09-23 ENCOUNTER — Other Ambulatory Visit: Payer: Self-pay

## 2019-09-23 DIAGNOSIS — U071 COVID-19: Secondary | ICD-10-CM | POA: Diagnosis not present

## 2019-09-23 DIAGNOSIS — Z5321 Procedure and treatment not carried out due to patient leaving prior to being seen by health care provider: Secondary | ICD-10-CM | POA: Insufficient documentation

## 2019-09-23 DIAGNOSIS — R0602 Shortness of breath: Secondary | ICD-10-CM | POA: Diagnosis present

## 2019-09-23 LAB — COMPREHENSIVE METABOLIC PANEL
ALT: 21 U/L (ref 0–44)
AST: 21 U/L (ref 15–41)
Albumin: 4 g/dL (ref 3.5–5.0)
Alkaline Phosphatase: 61 U/L (ref 38–126)
Anion gap: 10 (ref 5–15)
BUN: 14 mg/dL (ref 6–20)
CO2: 22 mmol/L (ref 22–32)
Calcium: 8.8 mg/dL — ABNORMAL LOW (ref 8.9–10.3)
Chloride: 107 mmol/L (ref 98–111)
Creatinine, Ser: 1.01 mg/dL (ref 0.61–1.24)
GFR calc Af Amer: >60 mL/min (ref >60)
GFR calc non Af Amer: >60 mL/min (ref >60)
Glucose, Bld: 89 mg/dL (ref 70–99)
Potassium: 4.2 mmol/L (ref 3.5–5.1)
Sodium: 139 mmol/L (ref 135–145)
Total Bilirubin: 0.6 mg/dL (ref 0.3–1.2)
Total Protein: 6.7 g/dL (ref 6.5–8.1)

## 2019-09-23 LAB — CBC
HCT: 48 % (ref 39.0–52.0)
Hemoglobin: 16.5 g/dL (ref 13.0–17.0)
MCH: 32 pg (ref 26.0–34.0)
MCHC: 34.4 g/dL (ref 30.0–36.0)
MCV: 93 fL (ref 80.0–100.0)
Platelets: 151 10*3/uL (ref 150–400)
RBC: 5.16 MIL/uL (ref 4.22–5.81)
RDW: 11.8 % (ref 11.5–15.5)
WBC: 3.7 10*3/uL — ABNORMAL LOW (ref 4.0–10.5)
nRBC: 0 % (ref 0.0–0.2)

## 2019-09-23 NOTE — ED Triage Notes (Signed)
Pt arrives POV for eval of SOB. Pt was dx'd w/ Covid last Friday and since that time has felt progressively worse. Pt reports extreme DOE, tachypneic in triage, but satting well on RA. Denies CP

## 2019-09-23 NOTE — ED Notes (Signed)
Pt called for room. No answer 

## 2019-09-29 DIAGNOSIS — U071 COVID-19: Secondary | ICD-10-CM | POA: Diagnosis not present

## 2019-09-29 DIAGNOSIS — J1282 Pneumonia due to coronavirus disease 2019: Secondary | ICD-10-CM | POA: Diagnosis not present

## 2019-09-29 DIAGNOSIS — Z6826 Body mass index (BMI) 26.0-26.9, adult: Secondary | ICD-10-CM | POA: Diagnosis not present

## 2019-11-08 DIAGNOSIS — E039 Hypothyroidism, unspecified: Secondary | ICD-10-CM | POA: Diagnosis not present

## 2019-11-08 DIAGNOSIS — Z8616 Personal history of COVID-19: Secondary | ICD-10-CM | POA: Diagnosis not present

## 2019-11-08 DIAGNOSIS — Z86010 Personal history of colonic polyps: Secondary | ICD-10-CM | POA: Diagnosis not present

## 2019-11-08 DIAGNOSIS — E785 Hyperlipidemia, unspecified: Secondary | ICD-10-CM | POA: Diagnosis not present

## 2020-02-16 DIAGNOSIS — E538 Deficiency of other specified B group vitamins: Secondary | ICD-10-CM | POA: Diagnosis not present

## 2020-02-16 DIAGNOSIS — Z20822 Contact with and (suspected) exposure to covid-19: Secondary | ICD-10-CM | POA: Diagnosis not present

## 2020-02-16 DIAGNOSIS — R42 Dizziness and giddiness: Secondary | ICD-10-CM | POA: Diagnosis not present

## 2020-02-16 DIAGNOSIS — Z6828 Body mass index (BMI) 28.0-28.9, adult: Secondary | ICD-10-CM | POA: Diagnosis not present

## 2020-03-27 DIAGNOSIS — R310 Gross hematuria: Secondary | ICD-10-CM | POA: Diagnosis not present

## 2020-04-03 DIAGNOSIS — R319 Hematuria, unspecified: Secondary | ICD-10-CM | POA: Diagnosis not present

## 2020-04-20 DIAGNOSIS — M19012 Primary osteoarthritis, left shoulder: Secondary | ICD-10-CM | POA: Diagnosis not present

## 2020-04-20 DIAGNOSIS — M5412 Radiculopathy, cervical region: Secondary | ICD-10-CM | POA: Diagnosis not present

## 2020-04-20 DIAGNOSIS — M19011 Primary osteoarthritis, right shoulder: Secondary | ICD-10-CM | POA: Diagnosis not present

## 2020-08-09 DIAGNOSIS — G43909 Migraine, unspecified, not intractable, without status migrainosus: Secondary | ICD-10-CM | POA: Diagnosis not present

## 2020-08-09 DIAGNOSIS — R94131 Abnormal electromyogram [EMG]: Secondary | ICD-10-CM | POA: Diagnosis not present

## 2020-08-09 DIAGNOSIS — S0990XD Unspecified injury of head, subsequent encounter: Secondary | ICD-10-CM | POA: Diagnosis not present

## 2020-08-09 DIAGNOSIS — R251 Tremor, unspecified: Secondary | ICD-10-CM | POA: Diagnosis not present

## 2020-08-23 DIAGNOSIS — Z8673 Personal history of transient ischemic attack (TIA), and cerebral infarction without residual deficits: Secondary | ICD-10-CM | POA: Diagnosis not present

## 2020-08-23 DIAGNOSIS — Z01818 Encounter for other preprocedural examination: Secondary | ICD-10-CM | POA: Diagnosis not present

## 2020-11-02 DIAGNOSIS — Z86010 Personal history of colonic polyps: Secondary | ICD-10-CM | POA: Diagnosis not present

## 2020-11-02 DIAGNOSIS — M47812 Spondylosis without myelopathy or radiculopathy, cervical region: Secondary | ICD-10-CM | POA: Diagnosis not present

## 2020-11-02 DIAGNOSIS — E039 Hypothyroidism, unspecified: Secondary | ICD-10-CM | POA: Diagnosis not present

## 2020-11-02 DIAGNOSIS — E785 Hyperlipidemia, unspecified: Secondary | ICD-10-CM | POA: Diagnosis not present

## 2020-11-19 DIAGNOSIS — L03012 Cellulitis of left finger: Secondary | ICD-10-CM | POA: Diagnosis not present

## 2020-11-19 DIAGNOSIS — W5501XA Bitten by cat, initial encounter: Secondary | ICD-10-CM | POA: Diagnosis not present

## 2020-11-19 DIAGNOSIS — S61032A Puncture wound without foreign body of left thumb without damage to nail, initial encounter: Secondary | ICD-10-CM | POA: Diagnosis not present

## 2021-12-04 IMAGING — DX DG CHEST 2V
2 series · 2 of 2 positions shown · non-contrast
Comparison: Radiograph 08/10/2017

CLINICAL DATA: Shortness of breath, XZUV9-VA positive

EXAM:
CHEST - 2 VIEW

[chest pa]
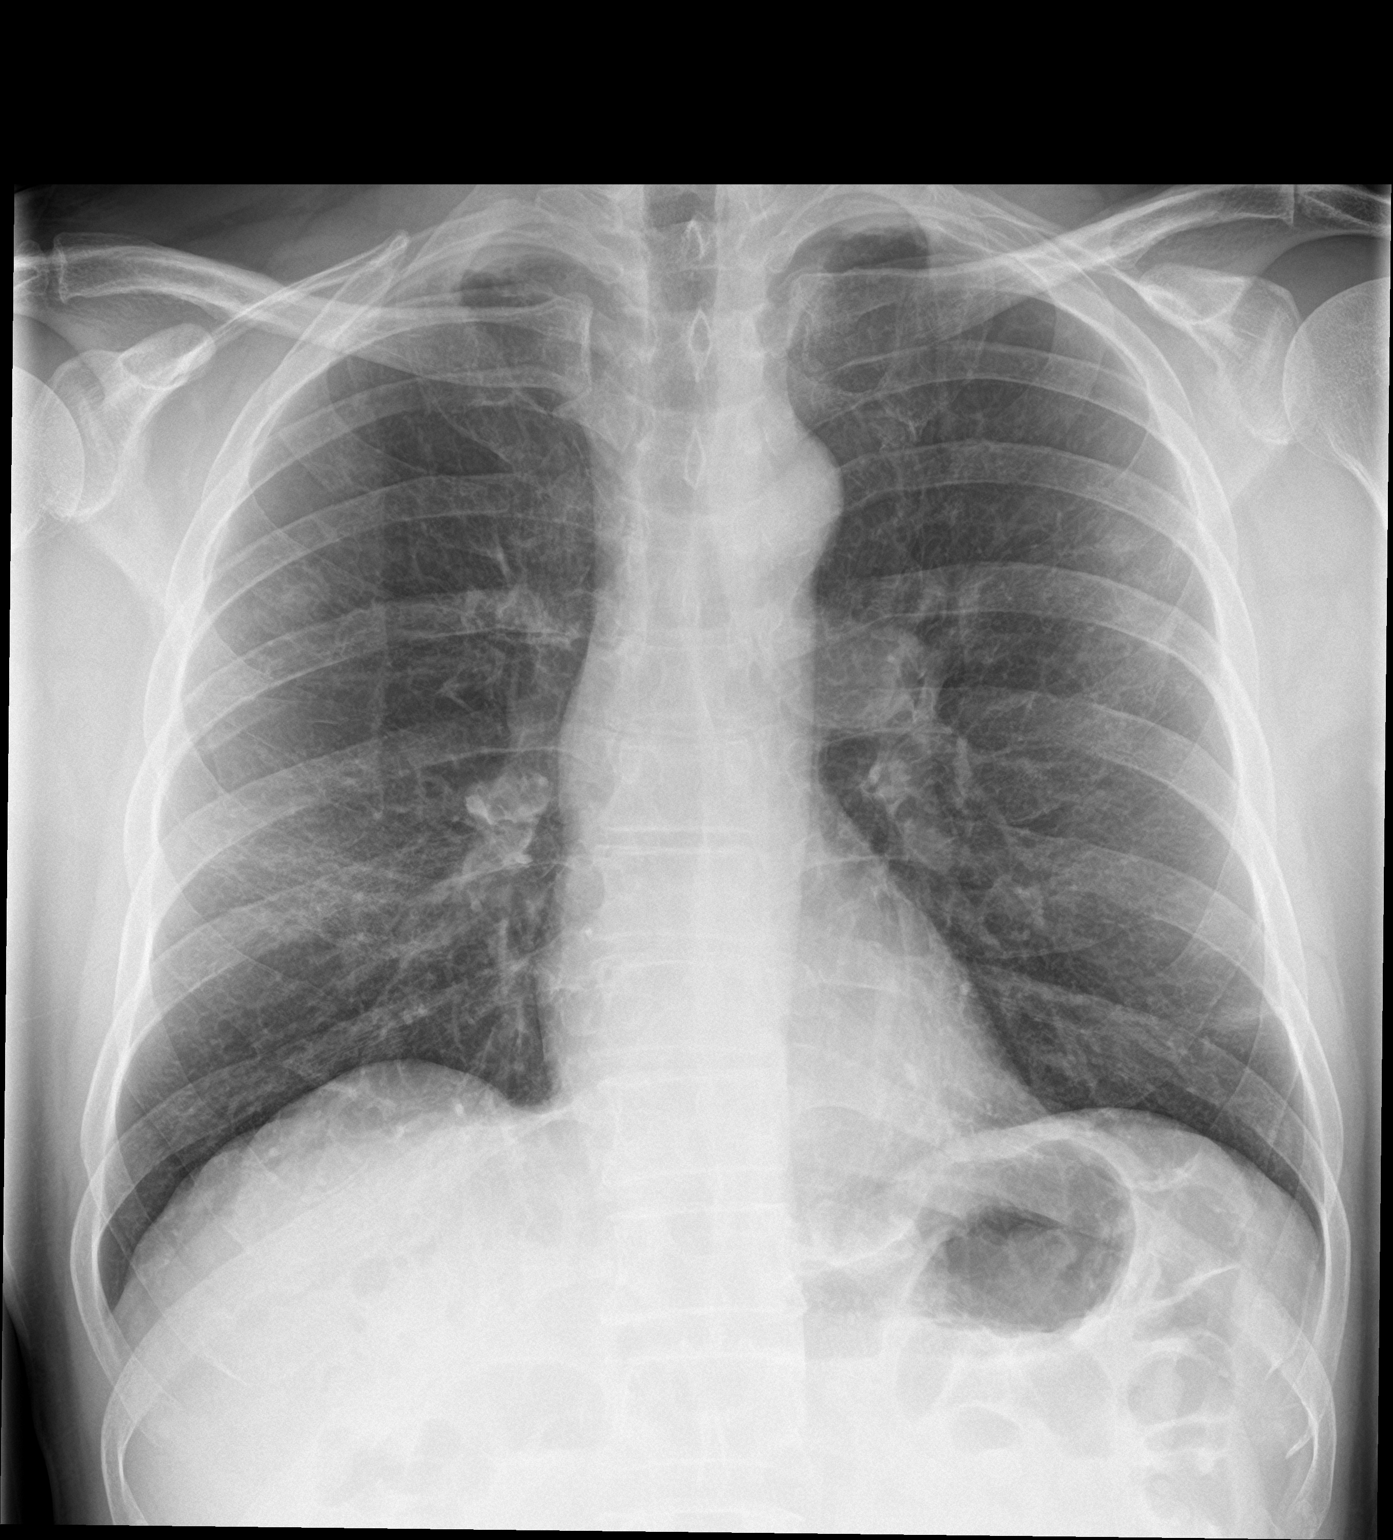

[chest lat]
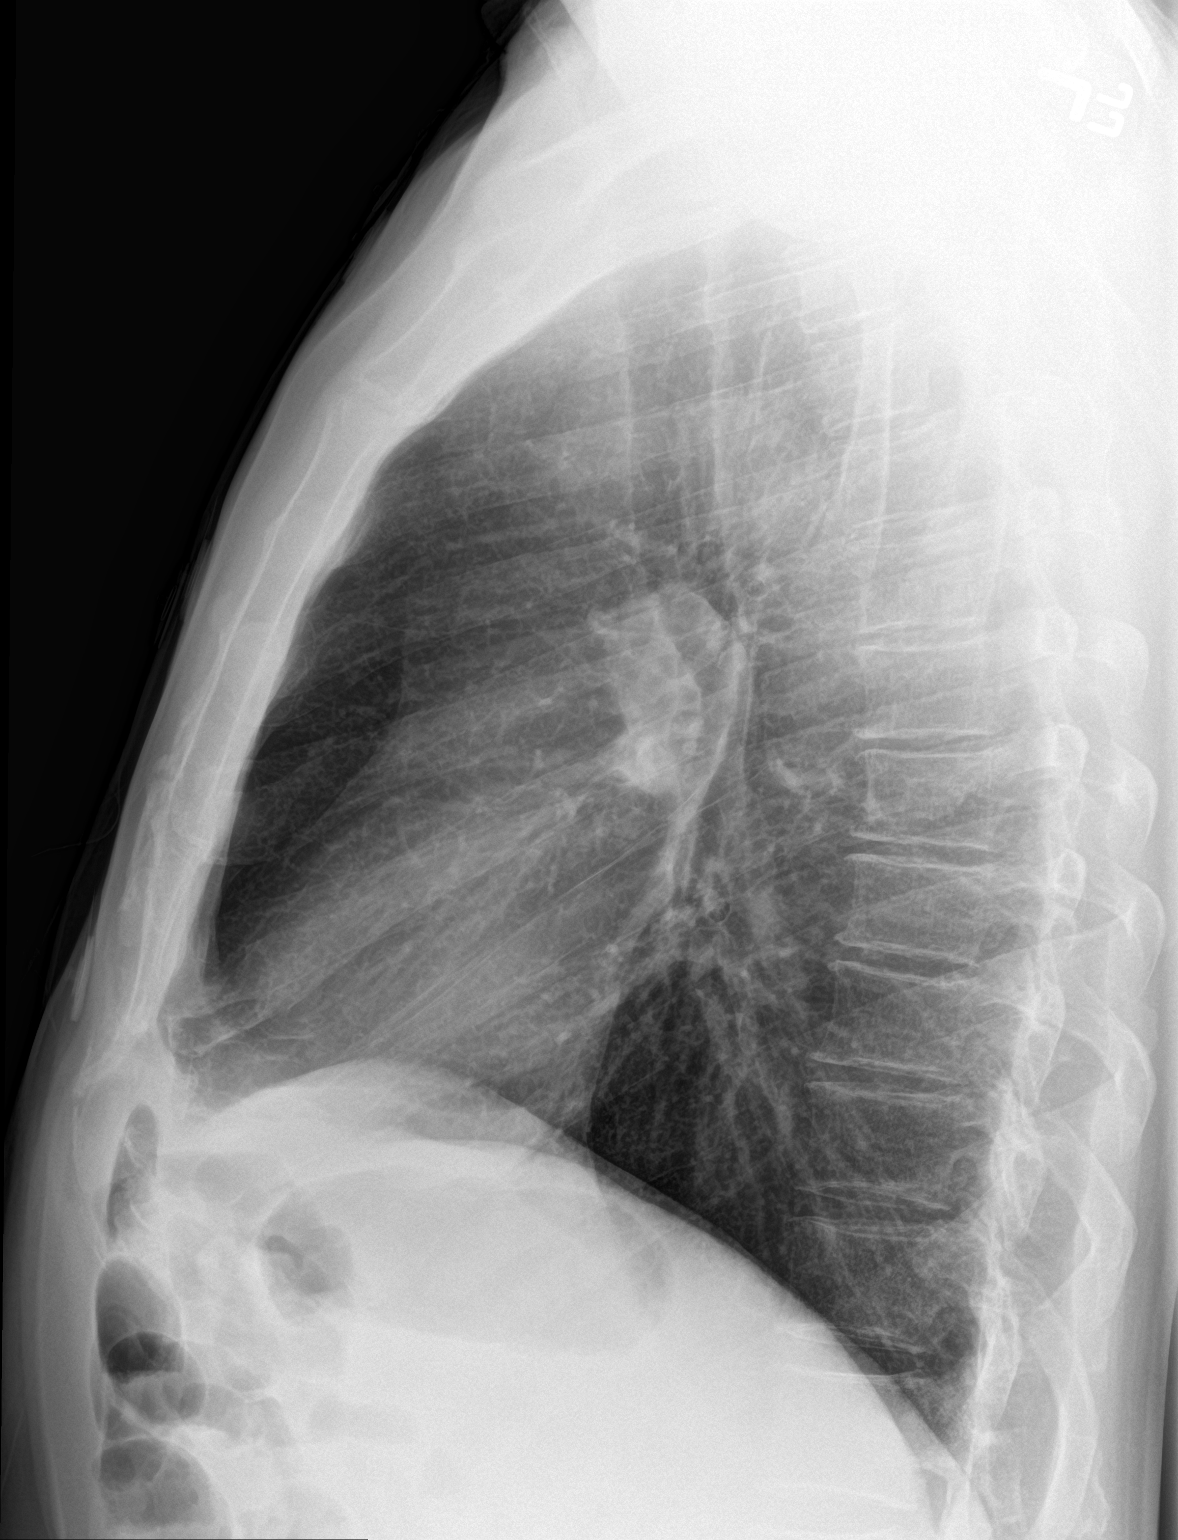

[2 of 2 positions shown; findings below may reference images not displayed]

FINDINGS: Some patchy opacities are present in the right upper and middle
lobes. Additional bandlike opacity in the left lung base, likely
subsegmental atelectasis or scarring. Calcified hilar adenopathy is
similar to comparison some may reflect remote granulomatous disease.
No pneumothorax or effusion. The aorta is calcified. The remaining
cardiomediastinal contours are unremarkable. No acute osseous or
soft tissue abnormality. Degenerative changes are present in the
imaged spine and shoulders.
IMPRESSION: 1. Patchy opacities in the right upper and middle lobes, compatible
with multifocal pneumonia in the setting of XZUV9-VA positivity.
2. Additional bandlike opacity in the left lung base, likely
subsegmental atelectasis or scarring.
3. Calcified hilar adenopathy, stable from multiple priors, likely
remote granulomatous disease.
